# Patient Record
Sex: Female | Born: 1940 | Race: White | Hispanic: No | Marital: Married | Smoking: Former smoker
Health system: Southern US, Community
[De-identification: ages and names within clinical notes are randomized; demographics above are authoritative.]

## PROBLEM LIST (undated history)

## (undated) DIAGNOSIS — F419 Anxiety disorder, unspecified: Secondary | ICD-10-CM

## (undated) DIAGNOSIS — N189 Chronic kidney disease, unspecified: Secondary | ICD-10-CM

## (undated) DIAGNOSIS — B977 Papillomavirus as the cause of diseases classified elsewhere: Secondary | ICD-10-CM

## (undated) DIAGNOSIS — L309 Dermatitis, unspecified: Secondary | ICD-10-CM

## (undated) DIAGNOSIS — Z973 Presence of spectacles and contact lenses: Secondary | ICD-10-CM

## (undated) DIAGNOSIS — I10 Essential (primary) hypertension: Secondary | ICD-10-CM

## (undated) DIAGNOSIS — Z8719 Personal history of other diseases of the digestive system: Secondary | ICD-10-CM

## (undated) DIAGNOSIS — Z87410 Personal history of cervical dysplasia: Secondary | ICD-10-CM

## (undated) DIAGNOSIS — K589 Irritable bowel syndrome without diarrhea: Secondary | ICD-10-CM

## (undated) DIAGNOSIS — Z8709 Personal history of other diseases of the respiratory system: Secondary | ICD-10-CM

## (undated) DIAGNOSIS — K219 Gastro-esophageal reflux disease without esophagitis: Secondary | ICD-10-CM

## (undated) DIAGNOSIS — C519 Malignant neoplasm of vulva, unspecified: Secondary | ICD-10-CM

## (undated) DIAGNOSIS — Z87440 Personal history of urinary (tract) infections: Secondary | ICD-10-CM

## (undated) HISTORY — PX: TONSILLECTOMY: SUR1361

## (undated) HISTORY — PX: OTHER SURGICAL HISTORY: SHX169

---

## 1969-02-13 HISTORY — PX: VAGINAL HYSTERECTOMY: SUR661

## 2003-08-23 ENCOUNTER — Encounter: Admission: RE | Admit: 2003-08-23 | Discharge: 2003-08-23 | Payer: Self-pay | Admitting: Family Medicine

## 2003-09-26 ENCOUNTER — Other Ambulatory Visit: Admission: RE | Admit: 2003-09-26 | Discharge: 2003-09-26 | Payer: Self-pay | Admitting: Family Medicine

## 2004-11-13 ENCOUNTER — Other Ambulatory Visit: Admission: RE | Admit: 2004-11-13 | Discharge: 2004-11-13 | Payer: Self-pay | Admitting: Family Medicine

## 2005-04-15 ENCOUNTER — Encounter: Admission: RE | Admit: 2005-04-15 | Discharge: 2005-04-15 | Payer: Self-pay | Admitting: Family Medicine

## 2005-11-18 ENCOUNTER — Other Ambulatory Visit: Admission: RE | Admit: 2005-11-18 | Discharge: 2005-11-18 | Payer: Self-pay | Admitting: Family Medicine

## 2006-04-21 ENCOUNTER — Encounter: Admission: RE | Admit: 2006-04-21 | Discharge: 2006-04-21 | Payer: Self-pay | Admitting: Family Medicine

## 2006-05-05 ENCOUNTER — Encounter: Admission: RE | Admit: 2006-05-05 | Discharge: 2006-05-05 | Payer: Self-pay | Admitting: Family Medicine

## 2007-01-26 ENCOUNTER — Other Ambulatory Visit: Admission: RE | Admit: 2007-01-26 | Discharge: 2007-01-26 | Payer: Self-pay | Admitting: Family Medicine

## 2008-03-08 ENCOUNTER — Encounter: Admission: RE | Admit: 2008-03-08 | Discharge: 2008-03-08 | Payer: Self-pay | Admitting: Family Medicine

## 2008-04-19 ENCOUNTER — Ambulatory Visit: Payer: Self-pay | Admitting: Internal Medicine

## 2008-05-03 ENCOUNTER — Encounter: Payer: Self-pay | Admitting: Internal Medicine

## 2008-05-03 ENCOUNTER — Ambulatory Visit: Payer: Self-pay | Admitting: Internal Medicine

## 2008-05-03 HISTORY — PX: COLONOSCOPY: SHX174

## 2008-05-05 ENCOUNTER — Encounter: Payer: Self-pay | Admitting: Internal Medicine

## 2009-04-30 ENCOUNTER — Other Ambulatory Visit: Admission: RE | Admit: 2009-04-30 | Discharge: 2009-04-30 | Payer: Self-pay | Admitting: Family Medicine

## 2010-05-26 ENCOUNTER — Encounter
Admission: RE | Admit: 2010-05-26 | Discharge: 2010-05-26 | Payer: Self-pay | Source: Home / Self Care | Attending: Family Medicine | Admitting: Family Medicine

## 2010-07-06 ENCOUNTER — Encounter: Payer: Self-pay | Admitting: Family Medicine

## 2011-02-12 ENCOUNTER — Ambulatory Visit
Admission: RE | Admit: 2011-02-12 | Discharge: 2011-02-12 | Disposition: A | Discharge status: Home / Self Care | Payer: Medicare Other | Source: Ambulatory Visit | Attending: Chiropractic Medicine | Admitting: Chiropractic Medicine

## 2011-02-12 ENCOUNTER — Other Ambulatory Visit: Payer: Self-pay | Admitting: Chiropractic Medicine

## 2011-02-12 DIAGNOSIS — M545 Low back pain: Secondary | ICD-10-CM

## 2011-02-12 DIAGNOSIS — M542 Cervicalgia: Secondary | ICD-10-CM

## 2011-02-12 DIAGNOSIS — M5412 Radiculopathy, cervical region: Secondary | ICD-10-CM

## 2011-12-29 ENCOUNTER — Other Ambulatory Visit: Payer: Self-pay | Admitting: Family Medicine

## 2011-12-29 DIAGNOSIS — Z1231 Encounter for screening mammogram for malignant neoplasm of breast: Secondary | ICD-10-CM

## 2011-12-31 ENCOUNTER — Ambulatory Visit
Admission: RE | Admit: 2011-12-31 | Discharge: 2011-12-31 | Disposition: A | Discharge status: Home / Self Care | Payer: Medicare Other | Source: Ambulatory Visit | Attending: Family Medicine | Admitting: Family Medicine

## 2011-12-31 DIAGNOSIS — Z1231 Encounter for screening mammogram for malignant neoplasm of breast: Secondary | ICD-10-CM

## 2012-01-07 ENCOUNTER — Other Ambulatory Visit: Payer: Self-pay | Admitting: Family Medicine

## 2012-01-07 ENCOUNTER — Other Ambulatory Visit (HOSPITAL_COMMUNITY)
Admission: RE | Admit: 2012-01-07 | Discharge: 2012-01-07 | Disposition: A | Discharge status: Home / Self Care | Payer: Medicare Other | Source: Ambulatory Visit | Attending: Family Medicine | Admitting: Family Medicine

## 2012-01-07 DIAGNOSIS — Z01419 Encounter for gynecological examination (general) (routine) without abnormal findings: Secondary | ICD-10-CM | POA: Insufficient documentation

## 2013-04-03 ENCOUNTER — Other Ambulatory Visit: Payer: Self-pay

## 2013-04-03 DIAGNOSIS — Z1231 Encounter for screening mammogram for malignant neoplasm of breast: Secondary | ICD-10-CM

## 2013-04-27 ENCOUNTER — Ambulatory Visit: Payer: Medicare Other

## 2013-05-18 ENCOUNTER — Ambulatory Visit
Admission: RE | Admit: 2013-05-18 | Discharge: 2013-05-18 | Disposition: A | Discharge status: Home / Self Care | Payer: Self-pay | Source: Ambulatory Visit

## 2013-05-18 DIAGNOSIS — Z1231 Encounter for screening mammogram for malignant neoplasm of breast: Secondary | ICD-10-CM

## 2014-04-30 ENCOUNTER — Other Ambulatory Visit: Payer: Self-pay | Admitting: Chiropractic Medicine

## 2014-04-30 ENCOUNTER — Ambulatory Visit
Admission: RE | Admit: 2014-04-30 | Discharge: 2014-04-30 | Disposition: A | Discharge status: Home / Self Care | Payer: Commercial Managed Care - HMO | Source: Ambulatory Visit | Attending: Chiropractic Medicine | Admitting: Chiropractic Medicine

## 2014-04-30 DIAGNOSIS — R52 Pain, unspecified: Secondary | ICD-10-CM

## 2014-12-19 ENCOUNTER — Emergency Department (HOSPITAL_BASED_OUTPATIENT_CLINIC_OR_DEPARTMENT_OTHER): Payer: PPO

## 2014-12-19 ENCOUNTER — Encounter (HOSPITAL_BASED_OUTPATIENT_CLINIC_OR_DEPARTMENT_OTHER): Payer: Self-pay | Admitting: Emergency Medicine

## 2014-12-19 ENCOUNTER — Emergency Department (HOSPITAL_BASED_OUTPATIENT_CLINIC_OR_DEPARTMENT_OTHER)
Admission: EM | Admit: 2014-12-19 | Discharge: 2014-12-19 | Disposition: A | Discharge status: Home / Self Care | Payer: PPO | Attending: Emergency Medicine | Admitting: Emergency Medicine

## 2014-12-19 DIAGNOSIS — W19XXXA Unspecified fall, initial encounter: Secondary | ICD-10-CM

## 2014-12-19 DIAGNOSIS — Y9389 Activity, other specified: Secondary | ICD-10-CM | POA: Diagnosis not present

## 2014-12-19 DIAGNOSIS — S32028A Other fracture of second lumbar vertebra, initial encounter for closed fracture: Secondary | ICD-10-CM | POA: Insufficient documentation

## 2014-12-19 DIAGNOSIS — Z79899 Other long term (current) drug therapy: Secondary | ICD-10-CM | POA: Insufficient documentation

## 2014-12-19 DIAGNOSIS — W01198A Fall on same level from slipping, tripping and stumbling with subsequent striking against other object, initial encounter: Secondary | ICD-10-CM | POA: Diagnosis not present

## 2014-12-19 DIAGNOSIS — S3992XA Unspecified injury of lower back, initial encounter: Secondary | ICD-10-CM | POA: Diagnosis present

## 2014-12-19 DIAGNOSIS — Y929 Unspecified place or not applicable: Secondary | ICD-10-CM | POA: Insufficient documentation

## 2014-12-19 DIAGNOSIS — Y998 Other external cause status: Secondary | ICD-10-CM | POA: Diagnosis not present

## 2014-12-19 DIAGNOSIS — Z87891 Personal history of nicotine dependence: Secondary | ICD-10-CM | POA: Insufficient documentation

## 2014-12-19 DIAGNOSIS — S32020A Wedge compression fracture of second lumbar vertebra, initial encounter for closed fracture: Secondary | ICD-10-CM

## 2014-12-19 DIAGNOSIS — F419 Anxiety disorder, unspecified: Secondary | ICD-10-CM | POA: Insufficient documentation

## 2014-12-19 HISTORY — DX: Anxiety disorder, unspecified: F41.9

## 2014-12-19 MED ORDER — ONDANSETRON 4 MG PO TBDP
4.0000 mg | ORAL_TABLET | Freq: Once | ORAL | Status: AC
  Administered 2014-12-19: 4 mg via ORAL
  Filled 2014-12-19: qty 1

## 2014-12-19 MED ORDER — MORPHINE SULFATE 4 MG/ML IJ SOLN
6.0000 mg | Freq: Once | INTRAMUSCULAR | Status: AC
  Administered 2014-12-19: 6 mg via INTRAMUSCULAR
  Filled 2014-12-19: qty 2

## 2014-12-19 MED ORDER — HYDROCODONE-ACETAMINOPHEN 5-325 MG PO TABS: 1.0000 | ORAL_TABLET | ORAL | Status: DC | PRN

## 2014-12-19 NOTE — ED Provider Notes (Signed)
CSN: [id number]     Arrival date & time 12/19/14  1945 History  This chart was scribed for Andrea Furry, MD by Steva Colder, ED Scribe. The patient was seen in room MH11/MH11 at 8:19 PM.     Chief Complaint  Patient presents with  . Fall      The history is provided by the patient. No language interpreter was used.    HPI Comments: Andrea Gomez is a 74 y.o. female who presents to the Emergency Department complaining of fall onset tonight. Pt notes that she was playing with her grandson when she took a step backwards, tripped, and fell onto her buttocks. Pt notes that she has not walked since the incident occurred. Pt rates her back pain as 6/10 and that she has had mild issues with her back that she sees an orthopedist for. She states that she is having associated symptoms of left buttock pain. She denies hitting her head, LOC, neck pain, tingling, leg pain, HA and any other symptoms. Pt denies taking blood thinners. Denies any allergies to medications.   Past Medical History  Diagnosis Date  . Anxiety    Past Surgical History  Procedure Laterality Date  . Abdominal hysterectomy    . Wrist surgery    . Tonsillectomy     No family history on file. History  Substance Use Topics  . Smoking status: Former Research scientist (life sciences)  . Smokeless tobacco: Not on file  . Alcohol Use: No   OB History    No data available     Review of Systems  Constitutional: Negative for fever, chills, diaphoresis, appetite change and fatigue.  HENT: Negative for mouth sores, sore throat and trouble swallowing.   Eyes: Negative for visual disturbance.  Respiratory: Negative for cough, chest tightness, shortness of breath and wheezing.   Cardiovascular: Negative for chest pain.  Gastrointestinal: Negative for nausea, vomiting, abdominal pain, diarrhea and abdominal distention.  Endocrine: Negative for polydipsia, polyphagia and polyuria.  Genitourinary: Negative for dysuria, frequency and hematuria.   Musculoskeletal: Positive for back pain. Negative for gait problem and neck pain.  Skin: Negative for color change, pallor and rash.  Neurological: Negative for dizziness, syncope, light-headedness and headaches.  Hematological: Does not bruise/bleed easily.  Psychiatric/Behavioral: Negative for behavioral problems and confusion.      Allergies  Review of patient's allergies indicates no known allergies.  Home Medications   Prior to Admission medications   Medication Sig Start Date End Date Taking? Authorizing Provider  DULoxetine (CYMBALTA) 60 MG capsule Take 60 mg by mouth 2 (two) times daily.   Yes Historical Provider, MD  HYDROcodone-acetaminophen (NORCO/VICODIN) 5-325 MG per tablet Take 1 tablet by mouth every 4 (four) hours as needed. 12/19/14   Andrea Furry, MD   BP 162/80 mmHg  Pulse 96  Temp(Src) 97.7 F (36.5 C) (Oral)  Resp 20  Ht 5\' 3"  (1.6 m)  Wt 161 lb 9.6 oz (73.301 kg)  BMI 28.63 kg/m2  SpO2 97% Physical Exam  Constitutional: She is oriented to person, place, and time. She appears well-developed and well-nourished. No distress.  HENT:  Head: Normocephalic.  Eyes: Conjunctivae are normal. Pupils are equal, round, and reactive to light. No scleral icterus.  Neck: Normal range of motion. Neck supple. No thyromegaly present.  Cardiovascular: Normal rate and regular rhythm.  Exam reveals no gallop and no friction rub.   No murmur heard. Pulmonary/Chest: Effort normal and breath sounds normal. No respiratory distress. She has no wheezes. She has no  rales.  Abdominal: Soft. Bowel sounds are normal. She exhibits no distension. There is no tenderness. There is no rebound.  Musculoskeletal: Normal range of motion.       Lumbar back: She exhibits tenderness.  Midline and paraspinal tenderness on the left from L1 and down. Nl strength and sensation to lower extremities.   Neurological: She is alert and oriented to person, place, and time.  Skin: Skin is warm and dry. No  rash noted.  Psychiatric: She has a normal mood and affect. Her behavior is normal.  Nursing note and vitals reviewed.   ED Course  Procedures (including critical care time) DIAGNOSTIC STUDIES: Oxygen Saturation is 97% on RA, nl by my interpretation.    COORDINATION OF CARE: 8:24 PM-Discussed treatment plan which includes muscle relaxer, pain medication, L-spine X-ray with pt at bedside and pt agreed to plan.   Labs Review Labs Reviewed - No data to display  Imaging Review Dg Thoracic Spine 2 View  12/19/2014   CLINICAL DATA:  Fall with diffuse thoracic spine pain. Initial encounter.  EXAM: THORACIC SPINE - 2-3 VIEWS  COMPARISON:  None.  FINDINGS: No evidence of thoracic spine fracture or traumatic malalignment. No notable degenerative change.  L2 compression fracture, reference dedicated lumbar spine imaging.  IMPRESSION: 1. Negative thoracic spine. 2. L2 compression fracture, reference dedicated imaging.   Electronically Signed   By: Monte Fantasia M.D.   On: 12/19/2014 21:40   Dg Lumbar Spine Complete  12/19/2014   CLINICAL DATA:  Fall, low back pain  EXAM: LUMBAR SPINE - COMPLETE 4+ VIEW  COMPARISON:  04/30/2014  FINDINGS: Alignment is normal. Intervertebral disc spaces are maintained. Atheromatous aortic calcification without aneurysm. Mild superior endplate compression deformity at L2 is identified, new since the prior 04/30/2014 exam but otherwise age indeterminate. No bony retropulsion. Stable degree of 5 mm anterolisthesis L5 on S1.  IMPRESSION: Mild superior endplate compression deformity at L2, new since the prior exam 04/30/2014 but otherwise age indeterminate, without bony retropulsion.   Electronically Signed   By: Conchita Paris M.D.   On: 12/19/2014 21:36   Dg Pelvis 1-2 Views  12/19/2014   CLINICAL DATA:  Fall  EXAM: PELVIS - 1-2 VIEW  COMPARISON:  04/30/2014  FINDINGS: There is no evidence of pelvic fracture or diastasis. No pelvic bone lesions are seen.  IMPRESSION:  Negative.   Electronically Signed   By: Conchita Paris M.D.   On: 12/19/2014 21:38     EKG Interpretation None      MDM   Final diagnoses:  Fall  Compression fracture of L2, closed, initial encounter   On recheck patient is laying on her right side. Hips and knees bent. That she feels much much better". Discussed the findings of her x-rays with her. The described compression fracture. Described treatment with pain control. Slowly increasing activity. Neurosurgical follow-up if not improving. She reports no neurological symptoms and has no neurological findings as intact on exam. Currently tolerating symptoms.  I personally performed the services described in this documentation, which was scribed in my presence. The recorded information has been reviewed and is accurate.   Andrea Furry, MD 12/19/14 2201

## 2014-12-19 NOTE — ED Notes (Addendum)
MD at bedside.  C-Collar removed by MD

## 2014-12-19 NOTE — ED Notes (Addendum)
Playing with grandson and took a step backward and fell onto buttocks.  Pain in Left buttock.

## 2014-12-19 NOTE — Discharge Instructions (Signed)
Rest. Avoid prolonged sitting or standing. Pain medications as prescribed. Follow-up with Dr. Ronnald Ramp, neurosurgeon, if your symptoms persist greater than 7-10 days.  Back, Compression Fracture A compression fracture happens when a force is put upon the length of your spine. Slipping and falling on your bottom are examples of such a force. When this happens, sometimes the force is great enough to compress the building blocks (vertebral bodies) of your spine. Although this causes a lot of pain, this can usually be treated at home, unless your caregiver feels hospitalization is needed for pain control. Your backbone (spinal column) is made up of 24 main vertebral bodies in addition to the sacrum and coccyx (see illustration). These are held together by tough fibrous tissues (ligaments) and by support of your muscles. Nerve roots pass through the openings between the vertebrae. A sudden wrenching move, injury, or a fall may cause a compression fracture of one of the vertebral bodies. This may result in back pain or spread of pain into the belly (abdomen), the buttocks, and down the leg into the foot. Pain may also be created by muscle spasm alone. Large studies have been undertaken to determine the best possible course of action to help your back following injury and also to prevent future problems. The recommendations are as follows. FOLLOWING A COMPRESSION FRACTURE: Do the following only if advised by your caregiver.   If a back brace has been suggested or provided, wear it as directed.  Do not stop wearing the back brace unless instructed by your caregiver.  When allowed to return to regular activities, avoid a sedentary lifestyle. Actively exercise. Sporadic weekend binges of tennis, racquetball, or waterskiing may actually aggravate or create problems, especially if you are not in condition for that activity.  Avoid sports requiring sudden body movements until you are in condition for them. Swimming  and walking are safer activities.  Maintain good posture.  Avoid obesity.  If not already done, you should have a DEXA scan. Based on the results, be treated for osteoporosis. FOLLOWING ACUTE (SUDDEN) INJURY:  Only take over-the-counter or prescription medicines for pain, discomfort, or fever as directed by your caregiver.  Use bed rest for only the most extreme acute episode. Prolonged bed rest may aggravate your condition. Ice used for acute conditions is effective. Use a large plastic bag filled with ice. Wrap it in a towel. This also provides excellent pain relief. This may be continuous. Or use it for 30 minutes every 2 hours during acute phase, then as needed. Heat for 30 minutes prior to activities is helpful.  As soon as the acute phase (the time when your back is too painful for you to do normal activities) is over, it is important to resume normal activities and work Tourist information centre manager. Back injuries can cause potentially marked changes in lifestyle. So it is important to attack these problems aggressively.  See your caregiver for continued problems. He or she can help or refer you for appropriate exercises, physical therapy, and work hardening if needed.  If you are given narcotic medications for your condition, for the next 24 hours do not:  Drive.  Operate machinery or power tools.  Sign legal documents.  Do not drink alcohol, or take sleeping pills or other medications that may interfere with treatment. If your caregiver has given you a follow-up appointment, it is very important to keep that appointment. Not keeping the appointment could result in a chronic or permanent injury, pain, and disability. If there is  any problem keeping the appointment, you must call back to this facility for assistance.  SEEK IMMEDIATE MEDICAL CARE IF:  You develop numbness, tingling, weakness, or problems with the use of your arms or legs.  You develop severe back pain not relieved with  medications.  You have changes in bowel or bladder control.  You have increasing pain in any areas of the body. Document Released: 06/01/2005 Document Revised: 10/16/2013 Document Reviewed: 01/04/2008 [facility] Patient Information 2015 [place], [place]. This information is not intended to replace advice given to you by your health care provider. Make sure you discuss any questions you have with your health care provider.

## 2015-08-22 DIAGNOSIS — M859 Disorder of bone density and structure, unspecified: Secondary | ICD-10-CM | POA: Diagnosis not present

## 2015-08-22 DIAGNOSIS — M8589 Other specified disorders of bone density and structure, multiple sites: Secondary | ICD-10-CM | POA: Diagnosis not present

## 2015-09-19 DIAGNOSIS — N9089 Other specified noninflammatory disorders of vulva and perineum: Secondary | ICD-10-CM | POA: Diagnosis not present

## 2015-09-19 DIAGNOSIS — M8580 Other specified disorders of bone density and structure, unspecified site: Secondary | ICD-10-CM | POA: Diagnosis not present

## 2015-09-19 DIAGNOSIS — Z1239 Encounter for other screening for malignant neoplasm of breast: Secondary | ICD-10-CM | POA: Diagnosis not present

## 2015-09-19 DIAGNOSIS — Z0000 Encounter for general adult medical examination without abnormal findings: Secondary | ICD-10-CM | POA: Diagnosis not present

## 2015-09-19 DIAGNOSIS — Z1389 Encounter for screening for other disorder: Secondary | ICD-10-CM | POA: Diagnosis not present

## 2015-09-19 DIAGNOSIS — M545 Low back pain: Secondary | ICD-10-CM | POA: Diagnosis not present

## 2015-09-19 DIAGNOSIS — Z1211 Encounter for screening for malignant neoplasm of colon: Secondary | ICD-10-CM | POA: Diagnosis not present

## 2015-09-19 DIAGNOSIS — F419 Anxiety disorder, unspecified: Secondary | ICD-10-CM | POA: Diagnosis not present

## 2015-09-19 DIAGNOSIS — E7800 Pure hypercholesterolemia, unspecified: Secondary | ICD-10-CM | POA: Diagnosis not present

## 2015-09-19 DIAGNOSIS — L230 Allergic contact dermatitis due to metals: Secondary | ICD-10-CM | POA: Diagnosis not present

## 2015-09-20 ENCOUNTER — Other Ambulatory Visit: Payer: Self-pay | Admitting: Family Medicine

## 2015-09-20 DIAGNOSIS — Z1231 Encounter for screening mammogram for malignant neoplasm of breast: Secondary | ICD-10-CM

## 2015-09-25 DIAGNOSIS — Z1211 Encounter for screening for malignant neoplasm of colon: Secondary | ICD-10-CM | POA: Diagnosis not present

## 2015-10-03 ENCOUNTER — Ambulatory Visit
Admission: RE | Admit: 2015-10-03 | Discharge: 2015-10-03 | Disposition: A | Discharge status: Home / Self Care | Payer: PPO | Source: Ambulatory Visit | Attending: Family Medicine | Admitting: Family Medicine

## 2015-10-03 ENCOUNTER — Other Ambulatory Visit: Payer: Self-pay | Admitting: Nurse Practitioner

## 2015-10-03 DIAGNOSIS — Z1231 Encounter for screening mammogram for malignant neoplasm of breast: Secondary | ICD-10-CM | POA: Diagnosis not present

## 2015-10-03 DIAGNOSIS — N903 Dysplasia of vulva, unspecified: Secondary | ICD-10-CM | POA: Diagnosis not present

## 2015-10-03 DIAGNOSIS — N901 Moderate vulvar dysplasia: Secondary | ICD-10-CM | POA: Diagnosis not present

## 2015-10-03 DIAGNOSIS — S30824A Blister (nonthermal) of vagina and vulva, initial encounter: Secondary | ICD-10-CM | POA: Diagnosis not present

## 2015-10-03 DIAGNOSIS — D071 Carcinoma in situ of vulva: Secondary | ICD-10-CM | POA: Diagnosis not present

## 2015-10-03 DIAGNOSIS — C519 Malignant neoplasm of vulva, unspecified: Secondary | ICD-10-CM | POA: Diagnosis not present

## 2015-10-08 ENCOUNTER — Other Ambulatory Visit: Payer: Self-pay | Admitting: Nurse Practitioner

## 2015-10-08 DIAGNOSIS — L089 Local infection of the skin and subcutaneous tissue, unspecified: Secondary | ICD-10-CM | POA: Diagnosis not present

## 2015-10-14 DIAGNOSIS — Z87891 Personal history of nicotine dependence: Secondary | ICD-10-CM | POA: Diagnosis not present

## 2015-10-14 DIAGNOSIS — Z90710 Acquired absence of both cervix and uterus: Secondary | ICD-10-CM | POA: Diagnosis not present

## 2015-10-14 DIAGNOSIS — D071 Carcinoma in situ of vulva: Secondary | ICD-10-CM | POA: Diagnosis not present

## 2015-10-14 DIAGNOSIS — C519 Malignant neoplasm of vulva, unspecified: Secondary | ICD-10-CM | POA: Diagnosis not present

## 2015-10-14 DIAGNOSIS — Z87410 Personal history of cervical dysplasia: Secondary | ICD-10-CM | POA: Diagnosis not present

## 2015-10-25 DIAGNOSIS — C519 Malignant neoplasm of vulva, unspecified: Secondary | ICD-10-CM | POA: Diagnosis not present

## 2015-10-25 DIAGNOSIS — Z87891 Personal history of nicotine dependence: Secondary | ICD-10-CM | POA: Diagnosis not present

## 2015-10-25 DIAGNOSIS — D071 Carcinoma in situ of vulva: Secondary | ICD-10-CM | POA: Diagnosis not present

## 2015-10-25 DIAGNOSIS — Z90710 Acquired absence of both cervix and uterus: Secondary | ICD-10-CM | POA: Diagnosis not present

## 2015-10-25 DIAGNOSIS — Z87410 Personal history of cervical dysplasia: Secondary | ICD-10-CM | POA: Diagnosis not present

## 2015-12-09 ENCOUNTER — Encounter: Payer: Self-pay | Admitting: Gynecologic Oncology

## 2015-12-09 ENCOUNTER — Ambulatory Visit: Payer: PPO | Attending: Gynecologic Oncology | Admitting: Gynecologic Oncology

## 2015-12-09 VITALS — BP 139/75 | HR 102 | Temp 99.4°F | Resp 18 | Wt 166.2 lb

## 2015-12-09 DIAGNOSIS — E669 Obesity, unspecified: Secondary | ICD-10-CM | POA: Insufficient documentation

## 2015-12-09 DIAGNOSIS — Z90710 Acquired absence of both cervix and uterus: Secondary | ICD-10-CM | POA: Insufficient documentation

## 2015-12-09 DIAGNOSIS — C519 Malignant neoplasm of vulva, unspecified: Secondary | ICD-10-CM | POA: Diagnosis not present

## 2015-12-09 DIAGNOSIS — Z6829 Body mass index (BMI) 29.0-29.9, adult: Secondary | ICD-10-CM | POA: Insufficient documentation

## 2015-12-09 DIAGNOSIS — Z87891 Personal history of nicotine dependence: Secondary | ICD-10-CM | POA: Diagnosis not present

## 2015-12-09 DIAGNOSIS — Z8541 Personal history of malignant neoplasm of cervix uteri: Secondary | ICD-10-CM | POA: Diagnosis not present

## 2015-12-09 DIAGNOSIS — F419 Anxiety disorder, unspecified: Secondary | ICD-10-CM | POA: Insufficient documentation

## 2015-12-09 NOTE — Patient Instructions (Signed)
Preparing for your Surgery  Plan for surgery on July 5 , 2017 with Dr.Emma Denman George for a vulvectomy at the [facility]. You will receive a call from the out patient surgical center. Please call if you any changes , questions or concerns.  Thank you!

## 2015-12-09 NOTE — Progress Notes (Signed)
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 Vulvar Cancer    New patient, second opinion    Assessment/Plan:  [name]  is a 75 y.o.  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 The patient is very anxious about an excisional procedure, loss of clitoral function and deformity.  However, I explained to her that I do not feel comfortable performing an ablative procedure such as laser, in the face of confirmed invasive cancer on biopsy. Additionally, there is some nodularity anteriorally which is suspicious for invasive carcinoma.   I recommend wide local excision of the left anterior vulva. We will attempt to spare the clitoral glans, but will need to resect the left hood of the clitoris. I explained that the margin at this site may be positive for VIN (or even carcinoma) which increases the risk for recurrence, and may need to be re-resected. We will then evaluate pathology and depth of invasion. If invasion is >58mm, she will require bilateral lymphadenectomy and possibly, deeper re-excision.  The patient is agreeable to the plan.  I discussed surgical risks, including the potential for infection, wound separation, sexual dysfunction.  I explained postoperative healing and restrictions. Surgery is scheduled for next week.   HPI: Andrea Gomez is a 75 year old woman who is seen as a second opinion for vulvar cancer. The patient was seen by NP Auma Thongteum in April 2017 for annual surveillance visit. She reported the symptom of vulvar pruritus at this time and therefore was evaluated. Several lesions on the left and anterior vulva were appreciated and were biopsied. A biopsy marked vulvar biopsy red upper was resulted as high-grade vulvar intraepithelial neoplasia, VIN 3, with focal microscopic invasive squamous cell  carcinoma. The a.m. 3 was also identified and all other sites of biopsies which included the left inner the left outer, a left black spot, and white upper. The specific anatomic location of these biopsy sites was not available.  The patient was seen in consultation by Dr. Benjamine Mola skin and [facility] on 10/25/2015 and was recommended to undergo an excisional procedure of the vulva to determine the depth of invasion and the need for lymphadenectomy and possible radical vulvectomy. The patient was concerned about healing and to formally following such a surgery and spent a period of time evaluating online literature. She had done some reading which showed that it was [facility] office minimally invasive surgery approaches such as laparoscopy and was wondering if this was appropriate for her.  The patient has a remote history of HPV related cervical dysplasia and, approximately 40 years ago, underwent a total hysterectomy for this. She had routine Surveillance in the intervening years and denies being HPV positive during that time.  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 She is a nonsmoker.   Current Meds:  Outpatient Encounter Prescriptions as of 12/09/2015  Medication Sig  . cyclobenzaprine (FLEXERIL) 10 MG tablet Take 10 mg by mouth at bedtime.  . DULoxetine (CYMBALTA) 60 MG capsule Take 60 mg by mouth 2 (two) times daily.  . nabumetone (RELAFEN) 750 MG tablet Take 750 mg by mouth 2 (two) times daily as needed.  . triamcinolone cream (KENALOG) 0.1 % apply to affected area twice a day sparingly  . [DISCONTINUED] HYDROcodone-acetaminophen (NORCO/VICODIN) 5-325 MG per tablet  Take 1 tablet by mouth every 4 (four) hours as needed.   No facility-administered encounter medications on file as of 12/09/2015.    Allergy: No Known Allergies  Social Hx:   Social History   Social History  . Marital Status: Married    Spouse Name: N/A  . Number of Children:  N/A  . Years of Education: N/A   Occupational History  . Not on file.   Social History Main Topics  . Smoking status: Former Smoker    Quit date: 12/09/1978  . Smokeless tobacco: Not on file  . Alcohol Use: No  . Drug Use: No  . Sexual Activity: Yes    Birth Control/ Protection: Surgical   Other Topics Concern  . Not on file   Social History Narrative    Past Surgical Hx:  Past Surgical History  Procedure Laterality Date  . Abdominal hysterectomy    . Wrist surgery    . Tonsillectomy      Past Medical Hx:  Past Medical History  Diagnosis Date  . Anxiety   . Cervical cancer (HCC)     DX in late 1970's    Past Gynecological History:  Hysterectomy for CIN  No LMP recorded. Patient has had a hysterectomy.  Family Hx: History reviewed. No pertinent family history.  Review of Systems:  Constitutional  Feels well,    ENT Normal appearing ears and nares bilaterally Skin/Breast  No rash, sores, jaundice, itching, dryness Cardiovascular  No chest pain, shortness of breath, or edema  Pulmonary  No cough or wheeze.  Gastro Intestinal  No nausea, vomitting, or diarrhoea. No bright red blood per rectum, no abdominal pain, change in bowel movement, or constipation.  Genito Urinary  No frequency, urgency, dysuria, + vulvar pruritis Musculo Skeletal  No myalgia, arthralgia, joint swelling or pain  Neurologic  No weakness, numbness, change in gait,  Psychology  No depression, anxiety, insomnia.   Vitals:  Blood pressure 139/75, pulse 102, temperature 99.4 F (37.4 C), temperature source Oral, resp. rate 18, weight 166 lb 3.2 oz (75.388 kg), SpO2 98 %.  Physical Exam: WD in NAD Neck  Supple NROM, without any enlargements.  Lymph Node Survey No cervical supraclavicular or inguinal adenopathy Cardiovascular  Pulse normal rate, regularity and rhythm. S1 and S2 normal.  Lungs  Clear to auscultation bilateraly, without wheezes/crackles/rhonchi. Good air movement.   Skin  No rash/lesions/breakdown  Psychiatry  Alert and oriented to person, place, and time  Abdomen  Normoactive bowel sounds, abdomen soft, non-tender and obese without evidence of hernia.  Back No CVA tenderness Genito Urinary  Vulva/vagina: 5% acetic acid was applied to the vulva and a broad 6cm plaque was appreciated that included acetowhite changes and erythema and extended from the midline anteriorally, inclusive of the left clitoral hood, to the left labia minora and interlabial fold and extended posteriorally along halfway of the labia minora. There was some mild nodularity in the tissues immediately lateral (left) to the clitoris at the anterior aspect of the labia minora of the left. This is a laterally ambiguous lesion.  Bladder/urethra:  No lesions or masses, well supported bladder  Vagina: normal  Cervix and uterus: surgically absent  Adnexa: no masses. Rectal  deferred  Extremities  No bilateral cyanosis, clubbing or edema.   Donaciano Eva, MD  12/09/2015, 5:58 PM

## 2015-12-10 ENCOUNTER — Encounter (HOSPITAL_BASED_OUTPATIENT_CLINIC_OR_DEPARTMENT_OTHER): Payer: Self-pay | Admitting: *Deleted

## 2015-12-11 ENCOUNTER — Encounter (HOSPITAL_BASED_OUTPATIENT_CLINIC_OR_DEPARTMENT_OTHER): Payer: Self-pay | Admitting: *Deleted

## 2015-12-11 NOTE — Progress Notes (Signed)
NPO AFTER MN.  ARRIVE  AT 0700.  NEEDS HG.  WILL TAKE CYMBALTA AM DOS W/ SIPS OF WATER.

## 2015-12-16 ENCOUNTER — Encounter (HOSPITAL_BASED_OUTPATIENT_CLINIC_OR_DEPARTMENT_OTHER): Payer: Self-pay | Admitting: *Deleted

## 2015-12-16 MED ORDER — LACTATED RINGERS IV SOLN
INTRAVENOUS | Status: DC
  Administered 2015-12-18: 08:00:00 via INTRAVENOUS
  Filled 2015-12-16: qty 1000

## 2015-12-17 NOTE — Anesthesia Preprocedure Evaluation (Addendum)
Anesthesia Evaluation  Patient identified by MRN, date of birth, ID band Patient awake    Reviewed: Allergy & Precautions, NPO status , Patient's Chart, lab work & pertinent test results  Airway Mallampati: II  TM Distance: <3 FB Neck ROM: Full    Dental no notable dental hx. (+) Teeth Intact, Dental Advisory Given, Implants,    Pulmonary neg pulmonary ROS, former smoker,    Pulmonary exam normal breath sounds clear to auscultation       Cardiovascular Exercise Tolerance: Good negative cardio ROS Normal cardiovascular exam Rhythm:Regular Rate:Normal     Neuro/Psych PSYCHIATRIC DISORDERS Anxiety negative neurological ROS     GI/Hepatic negative GI ROS, Neg liver ROS,   Endo/Other  negative endocrine ROS  Renal/GU negative Renal ROS     Musculoskeletal negative musculoskeletal ROS (+)   Abdominal   Peds  Hematology negative hematology ROS (+)   Anesthesia Other Findings   Reproductive/Obstetrics negative OB ROS                           Anesthesia Physical Anesthesia Plan  ASA: II  Anesthesia Plan: General   Post-op Pain Management:    Induction: Intravenous  Airway Management Planned: LMA  Additional Equipment:   Intra-op Plan:   Post-operative Plan: Extubation in OR  Informed Consent: I have reviewed the patients History and Physical, chart, labs and discussed the procedure including the risks, benefits and alternatives for the proposed anesthesia with the patient or authorized representative who has indicated his/her understanding and acceptance.   Dental advisory given  Plan Discussed with: CRNA  Anesthesia Plan Comments:         Anesthesia Quick Evaluation

## 2015-12-18 ENCOUNTER — Encounter (HOSPITAL_COMMUNITY)
Admission: RE | Disposition: A | Discharge status: Home / Self Care | Payer: Self-pay | Source: Ambulatory Visit | Attending: Gynecologic Oncology

## 2015-12-18 ENCOUNTER — Ambulatory Visit (HOSPITAL_COMMUNITY): Payer: PPO | Admitting: Anesthesiology

## 2015-12-18 ENCOUNTER — Encounter (HOSPITAL_COMMUNITY): Payer: Self-pay | Admitting: Anesthesiology

## 2015-12-18 ENCOUNTER — Ambulatory Visit (HOSPITAL_COMMUNITY)
Admission: RE | Admit: 2015-12-18 | Discharge: 2015-12-18 | Disposition: A | Discharge status: Home / Self Care | Payer: PPO | Source: Ambulatory Visit | Attending: Gynecologic Oncology | Admitting: Gynecologic Oncology

## 2015-12-18 DIAGNOSIS — C519 Malignant neoplasm of vulva, unspecified: Secondary | ICD-10-CM | POA: Diagnosis not present

## 2015-12-18 DIAGNOSIS — Z79899 Other long term (current) drug therapy: Secondary | ICD-10-CM | POA: Insufficient documentation

## 2015-12-18 DIAGNOSIS — Z87891 Personal history of nicotine dependence: Secondary | ICD-10-CM | POA: Insufficient documentation

## 2015-12-18 DIAGNOSIS — Z90710 Acquired absence of both cervix and uterus: Secondary | ICD-10-CM | POA: Insufficient documentation

## 2015-12-18 DIAGNOSIS — F419 Anxiety disorder, unspecified: Secondary | ICD-10-CM | POA: Insufficient documentation

## 2015-12-18 DIAGNOSIS — D071 Carcinoma in situ of vulva: Secondary | ICD-10-CM | POA: Insufficient documentation

## 2015-12-18 HISTORY — DX: Personal history of urinary (tract) infections: Z87.440

## 2015-12-18 HISTORY — DX: Malignant neoplasm of vulva, unspecified: C51.9

## 2015-12-18 HISTORY — DX: Presence of spectacles and contact lenses: Z97.3

## 2015-12-18 HISTORY — DX: Personal history of other diseases of the respiratory system: Z87.09

## 2015-12-18 HISTORY — PX: VULVECTOMY: SHX1086

## 2015-12-18 HISTORY — DX: Personal history of cervical dysplasia: Z87.410

## 2015-12-18 LAB — BASIC METABOLIC PANEL
Anion gap: 5 (ref 5–15)
BUN: 29 mg/dL — AB (ref 6–20)
CALCIUM: 9.4 mg/dL (ref 8.9–10.3)
CHLORIDE: 107 mmol/L (ref 101–111)
CO2: 29 mmol/L (ref 22–32)
CREATININE: 0.85 mg/dL (ref 0.44–1.00)
Glucose, Bld: 110 mg/dL — ABNORMAL HIGH (ref 65–99)
Potassium: 4.8 mmol/L (ref 3.5–5.1)
SODIUM: 141 mmol/L (ref 135–145)

## 2015-12-18 LAB — CBC
HCT: 45.4 % (ref 36.0–46.0)
Hemoglobin: 15.4 g/dL — ABNORMAL HIGH (ref 12.0–15.0)
MCH: 30.7 pg (ref 26.0–34.0)
MCHC: 33.9 g/dL (ref 30.0–36.0)
MCV: 90.6 fL (ref 78.0–100.0)
PLATELETS: 236 10*3/uL (ref 150–400)
RBC: 5.01 MIL/uL (ref 3.87–5.11)
RDW: 14.2 % (ref 11.5–15.5)
WBC: 7 10*3/uL (ref 4.0–10.5)

## 2015-12-18 SURGERY — WIDE EXCISION VULVECTOMY
Anesthesia: General | Laterality: Left

## 2015-12-18 MED ORDER — SODIUM CHLORIDE 0.9 % IJ SOLN
INTRAMUSCULAR | Status: AC
  Filled 2015-12-18: qty 10

## 2015-12-18 MED ORDER — LIDOCAINE HCL (CARDIAC) 20 MG/ML IV SOLN
INTRAVENOUS | Status: DC | PRN
  Administered 2015-12-18: 80 mg via INTRAVENOUS

## 2015-12-18 MED ORDER — PHENYLEPHRINE HCL 10 MG/ML IJ SOLN
INTRAMUSCULAR | Status: DC | PRN
  Administered 2015-12-18 (×3): 80 ug via INTRAVENOUS

## 2015-12-18 MED ORDER — DEXAMETHASONE SODIUM PHOSPHATE 4 MG/ML IJ SOLN
INTRAMUSCULAR | Status: DC | PRN
  Administered 2015-12-18: 10 mg via INTRAVENOUS

## 2015-12-18 MED ORDER — ONDANSETRON HCL 4 MG/2ML IJ SOLN
INTRAMUSCULAR | Status: DC | PRN
  Administered 2015-12-18: 4 mg via INTRAVENOUS

## 2015-12-18 MED ORDER — HYDROCODONE-ACETAMINOPHEN 5-325 MG PO TABS: 1.0000 | ORAL_TABLET | Freq: Four times a day (QID) | ORAL | Status: DC | PRN

## 2015-12-18 MED ORDER — DOCUSATE SODIUM 100 MG PO CAPS: 100.0000 mg | ORAL_CAPSULE | Freq: Two times a day (BID) | ORAL | Status: DC

## 2015-12-18 MED ORDER — ONDANSETRON HCL 4 MG/2ML IJ SOLN
INTRAMUSCULAR | Status: AC
  Filled 2015-12-18: qty 2

## 2015-12-18 MED ORDER — PROPOFOL 10 MG/ML IV BOLUS
INTRAVENOUS | Status: DC | PRN
  Administered 2015-12-18: 170 mg via INTRAVENOUS

## 2015-12-18 MED ORDER — FENTANYL CITRATE (PF) 100 MCG/2ML IJ SOLN
INTRAMUSCULAR | Status: DC | PRN
  Administered 2015-12-18 (×2): 50 ug via INTRAVENOUS

## 2015-12-18 MED ORDER — HYDROMORPHONE HCL 1 MG/ML IJ SOLN: 0.2500 mg | INTRAMUSCULAR | Status: DC | PRN

## 2015-12-18 MED ORDER — ACETIC ACID 5 % SOLN
Status: DC | PRN
  Administered 2015-12-18: 1 via TOPICAL

## 2015-12-18 MED ORDER — DEXAMETHASONE SODIUM PHOSPHATE 10 MG/ML IJ SOLN
INTRAMUSCULAR | Status: AC
  Filled 2015-12-18: qty 1

## 2015-12-18 MED ORDER — PROPOFOL 10 MG/ML IV BOLUS
INTRAVENOUS | Status: AC
  Filled 2015-12-18: qty 40

## 2015-12-18 MED ORDER — MIDAZOLAM HCL 2 MG/2ML IJ SOLN
INTRAMUSCULAR | Status: AC
  Filled 2015-12-18: qty 2

## 2015-12-18 MED ORDER — MEPERIDINE HCL 50 MG/ML IJ SOLN: 6.2500 mg | INTRAMUSCULAR | Status: DC | PRN

## 2015-12-18 MED ORDER — HYDROCODONE-ACETAMINOPHEN 5-325 MG PO TABS
1.0000 | ORAL_TABLET | Freq: Once | ORAL | Status: AC
  Administered 2015-12-18: 1 via ORAL
  Filled 2015-12-18: qty 1

## 2015-12-18 MED ORDER — MIDAZOLAM HCL 5 MG/5ML IJ SOLN
INTRAMUSCULAR | Status: DC | PRN
  Administered 2015-12-18: 2 mg via INTRAVENOUS

## 2015-12-18 MED ORDER — FENTANYL CITRATE (PF) 100 MCG/2ML IJ SOLN
INTRAMUSCULAR | Status: AC
  Filled 2015-12-18: qty 2

## 2015-12-18 MED ORDER — SODIUM CHLORIDE 0.9 % IR SOLN
Status: DC | PRN
  Administered 2015-12-18: 1000 mL

## 2015-12-18 MED ORDER — LIDOCAINE HCL (CARDIAC) 20 MG/ML IV SOLN
INTRAVENOUS | Status: AC
  Filled 2015-12-18: qty 5

## 2015-12-18 MED ORDER — LIDOCAINE-EPINEPHRINE 1 %-1:100000 IJ SOLN
INTRAMUSCULAR | Status: DC | PRN
  Administered 2015-12-18: 20 mL

## 2015-12-18 MED ORDER — PROMETHAZINE HCL 25 MG/ML IJ SOLN: 6.2500 mg | INTRAMUSCULAR | Status: DC | PRN

## 2015-12-18 MED ORDER — ARTIFICIAL TEARS OP OINT
TOPICAL_OINTMENT | OPHTHALMIC | Status: AC
  Filled 2015-12-18: qty 3.5

## 2015-12-18 MED ORDER — PHENYLEPHRINE 40 MCG/ML (10ML) SYRINGE FOR IV PUSH (FOR BLOOD PRESSURE SUPPORT)
PREFILLED_SYRINGE | INTRAVENOUS | Status: AC
  Filled 2015-12-18: qty 10

## 2015-12-18 SURGICAL SUPPLY — 44 items
BLADE HEX COATED 2.75 (ELECTRODE) ×1 IMPLANT
BLADE SURG 15 STRL LF DISP TIS (BLADE) ×2 IMPLANT
BLADE SURG 15 STRL SS (BLADE) ×2
CATH ROBINSON RED A/P 14FR (CATHETERS) ×1 IMPLANT
DRAPE LG THREE QUARTER DISP (DRAPES) ×1 IMPLANT
DRAPE UNDER BUTCK 40X44W/POUCH (DRAPES) ×1 IMPLANT
DRSG TELFA 3X8 NADH (GAUZE/BANDAGES/DRESSINGS) IMPLANT
ELECT NDL TIP 2.8 STRL (NEEDLE) IMPLANT
ELECT NEEDLE TIP 2.8 STRL (NEEDLE) IMPLANT
ELECT PENCIL ROCKER SW 15FT (MISCELLANEOUS) ×1 IMPLANT
ELECT REM PT RETURN 9FT ADLT (ELECTROSURGICAL) ×2
ELECTRODE REM PT RTRN 9FT ADLT (ELECTROSURGICAL) IMPLANT
GAUZE SPONGE 4X4 12PLY STRL (GAUZE/BANDAGES/DRESSINGS) ×2 IMPLANT
GAUZE SPONGE 4X4 16PLY XRAY LF (GAUZE/BANDAGES/DRESSINGS) ×4 IMPLANT
GLOVE BIO SURGEON STRL SZ 6 (GLOVE) ×4 IMPLANT
GOWN STRL REUS W/ TWL LRG LVL3 (GOWN DISPOSABLE) ×2 IMPLANT
GOWN STRL REUS W/TWL LRG LVL3 (GOWN DISPOSABLE) ×4
KIT BASIN OR (CUSTOM PROCEDURE TRAY) ×2 IMPLANT
NDL SPNL 22GX7 QUINCKE BK (NEEDLE) ×1 IMPLANT
NEEDLE HYPO 22GX1.5 SAFETY (NEEDLE) ×2 IMPLANT
NEEDLE SPNL 22GX7 QUINCKE BK (NEEDLE) IMPLANT
NS IRRIG 1000ML POUR BTL (IV SOLUTION) ×2 IMPLANT
PACK LITHOTOMY IV (CUSTOM PROCEDURE TRAY) ×2 IMPLANT
PAD DRESSING TELFA 3X8 NADH (GAUZE/BANDAGES/DRESSINGS) ×1 IMPLANT
PENCIL BUTTON HOLSTER BLD 10FT (ELECTRODE) ×1 IMPLANT
SCOPETTES 8  STERILE (MISCELLANEOUS)
SCOPETTES 8 STERILE (MISCELLANEOUS) ×1 IMPLANT
SUT VIC AB 0 CT1 27 (SUTURE) ×2
SUT VIC AB 0 CT1 27XBRD ANTBC (SUTURE) ×1 IMPLANT
SUT VIC AB 2-0 SH 27 (SUTURE)
SUT VIC AB 2-0 SH 27X BRD (SUTURE) IMPLANT
SUT VIC AB 3-0 SH 27 (SUTURE) ×10
SUT VIC AB 3-0 SH 27XBRD (SUTURE) ×2 IMPLANT
SUT VIC AB 4-0 P2 18 (SUTURE) ×2 IMPLANT
SUT VIC AB 4-0 PS2 18 (SUTURE) ×6 IMPLANT
SYR BULB IRRIGATION 50ML (SYRINGE) ×2 IMPLANT
SYR CONTROL 10ML LL (SYRINGE) ×2 IMPLANT
TOWEL OR 17X26 10 PK STRL BLUE (TOWEL DISPOSABLE) ×2 IMPLANT
TOWEL OR NON WOVEN STRL DISP B (DISPOSABLE) ×2 IMPLANT
TRAY FOLEY W/METER SILVER 14FR (SET/KITS/TRAYS/PACK) ×2 IMPLANT
TRAY FOLEY W/METER SILVER 16FR (SET/KITS/TRAYS/PACK) ×2 IMPLANT
UNDERPAD 30X30 INCONTINENT (UNDERPADS AND DIAPERS) ×1 IMPLANT
WATER STERILE IRR 1500ML POUR (IV SOLUTION) ×1 IMPLANT
YANKAUER SUCT BULB TIP NO VENT (SUCTIONS) ×2 IMPLANT

## 2015-12-18 NOTE — H&P (View-Only) (Signed)
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 Vulvar Cancer    New patient, second opinion    Assessment/Plan:  [name]  is a 76 y.o.  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 The patient is very anxious about an excisional procedure, loss of clitoral function and deformity.  However, I explained to her that I do not feel comfortable performing an ablative procedure such as laser, in the face of confirmed invasive cancer on biopsy. Additionally, there is some nodularity anteriorally which is suspicious for invasive carcinoma.   I recommend wide local excision of the left anterior vulva. We will attempt to spare the clitoral glans, but will need to resect the left hood of the clitoris. I explained that the margin at this site may be positive for VIN (or even carcinoma) which increases the risk for recurrence, and may need to be re-resected. We will then evaluate pathology and depth of invasion. If invasion is >14mm, she will require bilateral lymphadenectomy and possibly, deeper re-excision.  The patient is agreeable to the plan.  I discussed surgical risks, including the potential for infection, wound separation, sexual dysfunction.  I explained postoperative healing and restrictions. Surgery is scheduled for next week.   HPI: Andrea Gomez is a 75 year old woman who is seen as a second opinion for vulvar cancer. The patient was seen by NP Auma Thongteum in April 2017 for annual surveillance visit. She reported the symptom of vulvar pruritus at this time and therefore was evaluated. Several lesions on the left and anterior vulva were appreciated and were biopsied. A biopsy marked vulvar biopsy red upper was resulted as high-grade vulvar intraepithelial neoplasia, VIN 3, with focal microscopic invasive squamous cell  carcinoma. The a.m. 3 was also identified and all other sites of biopsies which included the left inner the left outer, a left black spot, and white upper. The specific anatomic location of these biopsy sites was not available.  The patient was seen in consultation by Dr. Benjamine Mola skin and [facility] on 10/25/2015 and was recommended to undergo an excisional procedure of the vulva to determine the depth of invasion and the need for lymphadenectomy and possible radical vulvectomy. The patient was concerned about healing and to formally following such a surgery and spent a period of time evaluating online literature. She had done some reading which showed that it was [facility] office minimally invasive surgery approaches such as laparoscopy and was wondering if this was appropriate for her.  The patient has a remote history of HPV related cervical dysplasia and, approximately 40 years ago, underwent a total hysterectomy for this. She had routine Surveillance in the intervening years and denies being HPV positive during that time.  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 She is a nonsmoker.   Current Meds:  Outpatient Encounter Prescriptions as of 12/09/2015  Medication Sig  . cyclobenzaprine (FLEXERIL) 10 MG tablet Take 10 mg by mouth at bedtime.  . DULoxetine (CYMBALTA) 60 MG capsule Take 60 mg by mouth 2 (two) times daily.  . nabumetone (RELAFEN) 750 MG tablet Take 750 mg by mouth 2 (two) times daily as needed.  . triamcinolone cream (KENALOG) 0.1 % apply to affected area twice a day sparingly  . [DISCONTINUED] HYDROcodone-acetaminophen (NORCO/VICODIN) 5-325 MG per tablet  Take 1 tablet by mouth every 4 (four) hours as needed.   No facility-administered encounter medications on file as of 12/09/2015.    Allergy: No Known Allergies  Social Hx:   Social History   Social History  . Marital Status: Married    Spouse Name: N/A  . Number of Children:  N/A  . Years of Education: N/A   Occupational History  . Not on file.   Social History Main Topics  . Smoking status: Former Smoker    Quit date: 12/09/1978  . Smokeless tobacco: Not on file  . Alcohol Use: No  . Drug Use: No  . Sexual Activity: Yes    Birth Control/ Protection: Surgical   Other Topics Concern  . Not on file   Social History Narrative    Past Surgical Hx:  Past Surgical History  Procedure Laterality Date  . Abdominal hysterectomy    . Wrist surgery    . Tonsillectomy      Past Medical Hx:  Past Medical History  Diagnosis Date  . Anxiety   . Cervical cancer (HCC)     DX in late 1970's    Past Gynecological History:  Hysterectomy for CIN  No LMP recorded. Patient has had a hysterectomy.  Family Hx: History reviewed. No pertinent family history.  Review of Systems:  Constitutional  Feels well,    ENT Normal appearing ears and nares bilaterally Skin/Breast  No rash, sores, jaundice, itching, dryness Cardiovascular  No chest pain, shortness of breath, or edema  Pulmonary  No cough or wheeze.  Gastro Intestinal  No nausea, vomitting, or diarrhoea. No bright red blood per rectum, no abdominal pain, change in bowel movement, or constipation.  Genito Urinary  No frequency, urgency, dysuria, + vulvar pruritis Musculo Skeletal  No myalgia, arthralgia, joint swelling or pain  Neurologic  No weakness, numbness, change in gait,  Psychology  No depression, anxiety, insomnia.   Vitals:  Blood pressure 139/75, pulse 102, temperature 99.4 F (37.4 C), temperature source Oral, resp. rate 18, weight 166 lb 3.2 oz (75.388 kg), SpO2 98 %.  Physical Exam: WD in NAD Neck  Supple NROM, without any enlargements.  Lymph Node Survey No cervical supraclavicular or inguinal adenopathy Cardiovascular  Pulse normal rate, regularity and rhythm. S1 and S2 normal.  Lungs  Clear to auscultation bilateraly, without wheezes/crackles/rhonchi. Good air movement.   Skin  No rash/lesions/breakdown  Psychiatry  Alert and oriented to person, place, and time  Abdomen  Normoactive bowel sounds, abdomen soft, non-tender and obese without evidence of hernia.  Back No CVA tenderness Genito Urinary  Vulva/vagina: 5% acetic acid was applied to the vulva and a broad 6cm plaque was appreciated that included acetowhite changes and erythema and extended from the midline anteriorally, inclusive of the left clitoral hood, to the left labia minora and interlabial fold and extended posteriorally along halfway of the labia minora. There was some mild nodularity in the tissues immediately lateral (left) to the clitoris at the anterior aspect of the labia minora of the left. This is a laterally ambiguous lesion.  Bladder/urethra:  No lesions or masses, well supported bladder  Vagina: normal  Cervix and uterus: surgically absent  Adnexa: no masses. Rectal  deferred  Extremities  No bilateral cyanosis, clubbing or edema.   Donaciano Eva, MD  12/09/2015, 5:58 PM

## 2015-12-18 NOTE — Discharge Instructions (Signed)
Vulvectomy, Care After °The vulva is the external female genitalia, outside and around the vagina and pubic bone. It consists of: °· The skin on, and in front of, the pubic bone. °· The clitoris. °· The labia majora (large lips) on the outside of the vagina. °· The labia minora (small lips) around the opening of the vagina. °· The opening and the skin in and around the vagina. °A vulvectomy is the removal of the tissue of the vulva, which sometimes includes removal of the lymph nodes and tissue in the groin areas. °These discharge instructions provide you with general information on caring for yourself after you leave the hospital. It is also important that you know the warning signs of complications, so that you can seek treatment. Please read the instructions outlined below and refer to this sheet in the next few weeks. Your caregiver may also give you specific information and medicines. If you have any questions or complications after discharge, please call your caregiver. °ACTIVITY °· Rest as much as possible the first two weeks after discharge. °· Arrange to have help from family or others with your daily activities when you go home. °· Avoid heavy lifting (more than 5 pounds), pushing, or pulling. °· If you feel tired, balance your activity with rest periods. °· Follow your caregiver's instruction about climbing stairs and driving a car. °· Increase activity gradually. °· Do not exercise until you have permission from your caregiver. °LEG AND FOOT CARE °If your doctor has removed lymph nodes from your groin area, there may be an increase in swelling of your legs and feet. You can help prevent swelling by doing the following: °· Elevate your legs while sitting or lying down. °· If your caregiver has ordered special stockings, wear them according to instructions. °· Avoid standing in one place for long periods of time. °· Call the physical therapy department if you have any questions about swelling or treatment  for swelling. °· Avoid salt in your diet. It can cause fluid retention and swelling. °· Do not cross your legs, especially when sitting. °NUTRITION °· You may resume your normal diet. °· Drink 6 to 8 glasses of fluids a day. °· Eat a healthy, balanced diet including portions of food from the meat (protein), milk, fruit, vegetable, and bread groups. °· Your caregiver may recommend you take a multivitamin with iron. °ELIMINATION °· You may notice that your stream of urine is at a different angle, and may tend to spray. Using a plastic funnel may help to decrease urine spray. °· If constipation occurs, drink more liquids, and add more fruits, vegetables, and bran to your diet. You may take a mild laxative, such as Milk of Magnesia, Metamucil, or a stool softener such as Colace, with permission from your caregiver. °HYGIENE °· You may shower and wash your hair. °· Check with your caregiver about tub baths. °· Do not add any bath oils or chemicals to your bath water, after you have permission to take baths. °· While passing urine, pour water from a bottle or spray over your vulva to dilute the urine as it passes the incision (this will decrease burning and discomfort). °· Clean yourself well after moving your bowels. °· After urinating, do not wipe. Dap or pat dry with toilet paper or a dry cleath soft cloth. °· A sitz bath will help keep your perineal area clean, reduce swelling, and provide comfort. °· Avoid wearing underpants for the first 2 weeks and wear loose skirts to   to allow circulation of air around the incision °· You do not need to apply dressings, salves or lotions to the wound. °· The stitches are self-dissolving and will absorb and disappear over a couple of months (it is normal to notice the knot from the stitches on toilet paper after voiding). °HOME CARE INSTRUCTIONS  °· Apply a soft ice pack (or frozen bag of peas) to your perineum (vulva) every hour in the first 48 hours after surgery. This  will reduce swelling. °· Avoid activities that involve a lot of friction between your legs. °· Avoid wearing pants or underpants in the 1st 2 weeks (skirts are preferable). °· Take your temperature twice a day and record it, especially if you feel feverish or have chills. °· Follow your caregiver's instructions about medicines, activity, and follow-up appointments after surgery. °· Do not drink alcohol while taking pain medicine. °· Change your dressing as advised by your caregiver. °· You may take over-the-counter medicine for pain, recommended by your caregiver. °· If your pain is not relieved with medicine, call your caregiver. °· Do not take aspirin because it can cause bleeding. °· Do not douche or use tampons (use a nonperfumed sanitary pad). °· Do not have sexual intercourse until your caregiver gives you permission (typically 6 weeks postoperatively). Hugging, kissing, and playful sexual activity is fine with your caregiver's permission. °· Warm sitz baths, with your caregiver's permission, are helpful to control swelling and discomfort. °· Take showers instead of baths, until your caregiver gives you permission to take baths. °· You may take a mild medicine for constipation, recommended by your caregiver. Bran foods and drinking a lot of fluids will help with constipation. °· Make sure your family understands everything about your operation and recovery. °SEEK MEDICAL CARE IF:  °· You notice swelling and redness around the wound area. °· You notice a foul smell coming from the wound or on the surgical dressing. °· You notice the wound is separating. °· You have painful or bloody urination. °· You develop nausea and vomiting. °· You develop diarrhea. °· You develop a rash. °· You have a reaction or allergy from the medicine. °· You feel dizzy or light-headed. °· You need stronger pain medicine. °SEEK IMMEDIATE MEDICAL CARE IF:  °· You develop a temperature of 102° F (38.9° C) or higher. °· You pass  out. °· You develop leg or chest pain. °· You develop abdominal pain. °· You develop shortness of breath. °· You develop bleeding from the wound area. °· You see pus in the wound area. °MAKE SURE YOU:  °· Understand these instructions. °· Will watch your condition. °· Will get help right away if you are not doing well or get worse. °Document Released: 01/14/2004 Document Revised: 10/16/2013 Document Reviewed: 05/03/2009 °Exit[employer]® Patient Information ©2015 [employer]. This information is not intended to replace advice given to you by your health care provider. Make sure you discuss any questions you have with your health care provider. °

## 2015-12-18 NOTE — Anesthesia Postprocedure Evaluation (Signed)
Anesthesia Post Note  Patient: Andrea Gomez  Procedure(s) Performed: Procedure(s) (LRB): WIDE EXCISION OF LEFT ANTERIOR VULVA (Left)  Patient location during evaluation: PACU Anesthesia Type: General Level of consciousness: sedated and patient cooperative Pain management: pain level controlled Vital Signs Assessment: post-procedure vital signs reviewed and stable Respiratory status: spontaneous breathing Cardiovascular status: stable Anesthetic complications: no    Last Vitals:  Filed Vitals:   12/18/15 1056 12/18/15 1209  BP: 153/81 153/86  Pulse: 71 96  Temp: 36.4 C 36.4 C  Resp: 12 14    Last Pain:  Filed Vitals:   12/18/15 1209  PainSc: [place]

## 2015-12-18 NOTE — Op Note (Signed)
PATIENT: Andrea Gomez DATE: 12/18/15   Preop Diagnosis: vulvar cancer and VIN3  Postoperative Diagnosis: same  Surgery: Partial simple left anterior vulvectomy  Surgeons:  Donaciano Eva, MD Assistant: none  Anesthesia: General   Estimated blood loss: <68ml  IVF:  276ml   Urine output: 50 ml   Complications: None   Pathology: left anterior labia minora with long marking stitch at 12 o'clock and short marking stitch at clitoral margin  Operative findings: 6cm patch of erythematous and acetowhite changes on left anterior labia minora abutting and involving left clitoral hood and extending posteriorally along labia minora and interlabial fold.  Procedure: The patient was identified in the preoperative holding area. Informed consent was signed on the chart. Patient was seen history was reviewed and exam was performed.   The patient was then taken to the operating room and placed in the supine position with SCD hose on. General anesthesia was then induced without difficulty. She was then placed in the dorsolithotomy position. The perineum was prepped with Betadine. The vagina was prepped with Betadine. The patient was then draped after the prep was dried. A Foley catheter was inserted into the bladder under sterile conditions.  Timeout was performed the patient, procedure, antibiotic, allergy, and length of procedure. 5% acetic acid solution was applied to the perineum. The vulvar tissues were inspected for areas of acetowhite changes or leukoplakia. The lesion was identified and the marking pen was used to circumscribe the area with appropriate surgical margins. The subcuticular tissues were infiltrated with 1% lidocaine. The 15 blade scalpel was used to make an incision through the skin circumferentially as marked. The skin elipse was grasped and was separated from the underlying deep dermal tissues with the bovie device. After the specimen had been completely resected, it  was oriented and marked at 12 o'clock with a long 0-vicryl suture and at the medial clitoral margin with a short suture. The bovie was used to obtain hemostasis at the surgical bed. The subcutaneous tissues were irrigated and made hemostatic.   The deep dermal layer was approximated with 3-0vicryl mattress sutures to bring the skin edges into approximation and off tension. The wound was closed following langher's lines. The cutaneous layer was closed with interrupted 4-0 vicryl stitches and mattress sutures to ensure a tension free and hemostatic closure. The perineum was again irrigated. The foley was removed.  All instrument, suture, laparotomy, Ray-Tec, and needle counts were correct x2. The patient tolerated the procedure well and was taken recovery room in stable condition. This is Andrea Gomez dictating an operative note on Andrea Gomez.  Donaciano Eva, MD

## 2015-12-18 NOTE — Interval H&P Note (Signed)
History and Physical Interval Note:  12/18/2015 8:25 AM  Andrea Gomez  has presented today for surgery, with the diagnosis of VULVAR CANCER  The various methods of treatment have been discussed with the patient and family. After consideration of risks, benefits and other options for treatment, the patient has consented to  Procedure(s): WIDE EXCISION OF LEFT ANTERIOR VULVA (Left) as a surgical intervention .  The patient's history has been reviewed, patient examined, no change in status, stable for surgery.  I have reviewed the patient's chart and labs.  Questions were answered to the patient's satisfaction.     Donaciano Eva

## 2015-12-18 NOTE — Transfer of Care (Signed)
Immediate Anesthesia Transfer of Care Note  Patient: Andrea Gomez  Procedure(s) Performed: Procedure(s): WIDE EXCISION OF LEFT ANTERIOR VULVA (Left)  Patient Location: PACU  Anesthesia Type:General  Level of Consciousness: awake, alert , oriented and patient cooperative  Airway & Oxygen Therapy: Patient Spontanous Breathing and Patient connected to nasal cannula oxygen  Post-op Assessment: Report given to RN and Post -op Vital signs reviewed and stable  Post vital signs: Reviewed and stable  Last Vitals:  Filed Vitals:   12/18/15 0634  BP: 124/78  Pulse: 88  Temp: 36.9 C  Resp: 16    Last Pain: There were no vitals filed for this visit.    Patients Stated Pain Goal: 4 ([id number])  Complications: No apparent anesthesia complications

## 2015-12-18 NOTE — Anesthesia Procedure Notes (Signed)
Procedure Name: LMA Insertion Date/Time: 12/18/2015 8:34 AM Performed by: Wanita Chamberlain Pre-anesthesia Checklist: Patient identified, Timeout performed, Emergency Drugs available, Suction available and Patient being monitored Patient Re-evaluated:Patient Re-evaluated prior to inductionOxygen Delivery Method: Circle system utilized Preoxygenation: Pre-oxygenation with 100% oxygen Intubation Type: IV induction Ventilation: Mask ventilation without difficulty LMA: LMA inserted LMA Size: 4.0 Number of attempts: 1 Placement Confirmation: positive ETCO2 Tube secured with: Tape Dental Injury: Teeth and Oropharynx as per pre-operative assessment

## 2015-12-20 ENCOUNTER — Telehealth: Payer: Self-pay | Admitting: Gynecologic Oncology

## 2015-12-20 NOTE — Telephone Encounter (Signed)
Contacted to speak with patient about pathology results.  Patient has stage IA vulvar cancer. Negative margins, but lateral margin positive for VIN3. No further surgical management necessary at this time.  Patient was "resting" and I spoke with her husband. I told him I was calling to relay her pathology results from her surgery. He said I should "call back later". I stated that because it was late on Friday afternoon, I would not be able to call back again until Monday.  He stated "that'll be all right". I mentioned to him that he should inform his wife that if she would like to know her pathology before Monday she should call me back within the next 5 minutes.  Donaciano Eva, MD

## 2015-12-25 ENCOUNTER — Ambulatory Visit: Payer: PPO | Attending: Gynecologic Oncology | Admitting: Gynecologic Oncology

## 2015-12-25 ENCOUNTER — Encounter: Payer: Self-pay | Admitting: Gynecologic Oncology

## 2015-12-25 VITALS — BP 151/68 | HR 110 | Temp 97.7°F | Resp 18 | Ht 63.0 in | Wt 163.3 lb

## 2015-12-25 DIAGNOSIS — Z8544 Personal history of malignant neoplasm of other female genital organs: Secondary | ICD-10-CM | POA: Insufficient documentation

## 2015-12-25 DIAGNOSIS — Z90710 Acquired absence of both cervix and uterus: Secondary | ICD-10-CM | POA: Insufficient documentation

## 2015-12-25 DIAGNOSIS — Z87891 Personal history of nicotine dependence: Secondary | ICD-10-CM | POA: Diagnosis not present

## 2015-12-25 DIAGNOSIS — C519 Malignant neoplasm of vulva, unspecified: Secondary | ICD-10-CM | POA: Diagnosis not present

## 2015-12-25 DIAGNOSIS — F419 Anxiety disorder, unspecified: Secondary | ICD-10-CM | POA: Diagnosis not present

## 2015-12-25 DIAGNOSIS — Z87410 Personal history of cervical dysplasia: Secondary | ICD-10-CM | POA: Insufficient documentation

## 2015-12-25 DIAGNOSIS — Z87440 Personal history of urinary (tract) infections: Secondary | ICD-10-CM | POA: Insufficient documentation

## 2015-12-25 DIAGNOSIS — Z9889 Other specified postprocedural states: Secondary | ICD-10-CM | POA: Diagnosis not present

## 2015-12-25 NOTE — Progress Notes (Signed)
Follow-up Note: Gyn-Onc  Second opinion by  Andrea Gomez 75 y.o. female with vulvar carcinoma  CC:  Chief Complaint  Patient presents with  . Routine Post Op    Assessment/Plan:  [name]  is a 75 y.o.  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  Healing normally. Will verify margin status with pathologist.  Recommend close follow-up with re-evaluation in 3 months. Discussed high risk for recurrence and notified her to contact me if she develops these symptoms.  HPI: Andrea Gomez is a 75 year old woman who is seen as a second opinion for vulvar cancer. The patient was seen by NP Andrea Gomez in April 2017 for annual surveillance visit. She reported the symptom of vulvar pruritus at this time and therefore was evaluated. Several lesions on the left and anterior vulva were appreciated and were biopsied. A biopsy marked vulvar biopsy red upper was resulted as high-grade vulvar intraepithelial neoplasia, VIN 3, with focal microscopic invasive squamous cell carcinoma. The a.m. 3 was also identified and all other sites of biopsies which included the left inner the left outer, a left black spot, and white upper. The specific anatomic location of these biopsy sites was not available.  The patient was seen in consultation by Dr. Benjamine Gomez skin and [facility] on 10/25/2015 and was recommended to undergo an excisional procedure of the vulva to determine the depth of invasion and the need for lymphadenectomy and possible radical vulvectomy. The patient was concerned about healing and to formally following such a surgery and spent a period of time evaluating online literature. She had done some reading which showed that it was [facility] office minimally invasive surgery approaches such as laparoscopy and was wondering if this was appropriate for her.  The patient has a remote history of HPV related  cervical dysplasia and, approximately 40 years ago, underwent a total hysterectomy for this. She had routine Surveillance in the intervening years and denies being HPV positive during that time.  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 She is a nonsmoker.   Interval Hx: On 12/18/15 she went to the OR for a wide local excision. The lesion abutted and involved the left clitoral hood which was excised. The lateral margin appeared grossly wide from the lesion.  Final pathology revealed high grade vulvar intraepithelial neoplasia, suspicious for focal invasion, with <0.18mm invasion. Positive margins "11-8 oclock" which represents the lateral margin.  Current Meds:  Outpatient Encounter Prescriptions as of 12/25/2015  Medication Sig  . DULoxetine (CYMBALTA) 60 MG capsule Take 60 mg by mouth 2 (two) times daily.  Marland Kitchen loratadine (ALLERGY RELIEF) 10 MG tablet Take 10 mg by mouth daily as needed for allergies or itching.  . nabumetone (RELAFEN) 750 MG tablet Take 750 mg by mouth 2 (two) times daily as needed for mild pain or moderate pain. Reported on 12/25/2015  . triamcinolone cream (KENALOG) 0.1 % apply to affected area twice a day sparingly  prn  . cyclobenzaprine (FLEXERIL) 10 MG tablet Take 10 mg by mouth at bedtime as needed for muscle spasms. Reported on 12/25/2015  . docusate sodium (COLACE) 100 MG capsule Take 1 capsule (100 mg total) by mouth 2 (two) times daily. (Patient not taking: Reported on 12/25/2015)  . HYDROcodone-acetaminophen (NORCO/VICODIN) 5-325 MG tablet Take 1 tablet by mouth every 6 (six) hours as needed for moderate pain. (Patient not  taking: Reported on 12/25/2015)  . OVER THE COUNTER MEDICATION Take 1 packet by mouth daily. Reported on 12/25/2015   No facility-administered encounter medications on file as of 12/25/2015.    Allergy: No Known Allergies  Social Hx:   Social History   Social History  . Marital Status: Married    Spouse Name:  N/A  . Number of Children: N/A  . Years of Education: N/A   Occupational History  . Not on file.   Social History Main Topics  . Smoking status: Former Smoker -- 2.00 packs/day for 20 years    Types: Cigarettes    Quit date: 12/09/1978  . Smokeless tobacco: Never Used  . Alcohol Use: No  . Drug Use: No  . Sexual Activity: Yes    Birth Control/ Protection: Surgical   Other Topics Concern  . Not on file   Social History Narrative    Past Surgical Hx:  Past Surgical History  Procedure Laterality Date  . Colonoscopy  05-03-2008  . Tonsillectomy  as teen  . Tendon repair left wrist in[place]  age 57  . Vaginal hysterectomy  1970's  . Vulvectomy Left 12/18/2015    Procedure: WIDE EXCISION OF LEFT ANTERIOR VULVA;  Surgeon: Andrea Amber, MD;  Location: [facility];  Service: Gynecology;  Laterality: Left;    Past Medical Hx:  Past Medical History  Diagnosis Date  . Anxiety   . History of cervical dysplasia     1970's  dx CIN/ HPV  s/p  TAH  . Vulva cancer ([place])     Stage 1A or 1B  Squamous Cell Carcinoma  . Wears glasses   . History of bronchitis   . History of urinary tract infection     Past Gynecological History:  Hysterectomy for CIN  No LMP recorded. Patient has had a hysterectomy.  Family Hx: History reviewed. No pertinent family history.  Review of Systems:  Constitutional  Feels well,    ENT Normal appearing ears and nares bilaterally Skin/Breast  No rash, sores, jaundice, itching, dryness Cardiovascular  No chest pain, shortness of breath, or edema  Pulmonary  No cough or wheeze.  Gastro Intestinal  No nausea, vomitting, or diarrhoea. No bright red blood per rectum, no abdominal pain, change in bowel movement, or constipation.  Genito Urinary  No frequency, urgency, dysuria,  Musculo Skeletal  No myalgia, arthralgia, joint swelling or pain  Neurologic  No weakness, numbness, change in gait,  Psychology  No depression, anxiety, insomnia.   Vitals:  Blood  pressure 151/68, pulse 110, temperature 97.7 F (36.5 C), temperature source Oral, resp. rate 18, height 5\' 3"  (1.6 m), weight 163 lb 4.8 oz (74.072 kg), SpO2 100 %.  Physical Exam: WD in NAD Neck  Supple NROM, without any enlargements.  Lymph Node Survey No cervical supraclavicular or inguinal adenopathy Cardiovascular  Pulse normal rate, regularity and rhythm. S1 and S2 normal.  Lungs  Clear to auscultation bilateraly, without wheezes/crackles/rhonchi. Good air movement.  Skin  No rash/lesions/breakdown  Psychiatry  Alert and oriented to person, place, and time  Abdomen  Normoactive bowel sounds, abdomen soft, non-tender and obese without evidence of hernia.  Back No CVA tenderness Genito Urinary  Vulva/vagina: Incision on left lateral anterior vulva and clitoris healing normally. No signs of infection.  Bladder/urethra:  No lesions or masses, well supported bladder  Vagina: normal  Cervix and uterus: surgically absent  Adnexa: no masses. Rectal  deferred  Extremities  No bilateral cyanosis, clubbing or edema.  Donaciano Eva, MD  12/25/2015, 4:49 PM

## 2016-01-06 ENCOUNTER — Ambulatory Visit: Payer: PPO | Admitting: Gynecologic Oncology

## 2016-01-08 ENCOUNTER — Ambulatory Visit: Payer: PPO | Admitting: Gynecologic Oncology

## 2016-02-10 ENCOUNTER — Telehealth: Payer: Self-pay | Admitting: Gynecologic Oncology

## 2016-02-10 NOTE — Telephone Encounter (Signed)
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  This is the second time she has experienced this.  She states she was given valtrex in the past by her PCP but by the time the starts taking it, it is gone.  Patient not wanting to come in at this time.  Wanting to adjust her appt in October because she will be out of town.  Advised patient to keep [place] posted on how she was doing and to call if the area does not begin to improve.  All questions answered.  Advised to call for any needs.

## 2016-03-09 ENCOUNTER — Other Ambulatory Visit: Payer: Self-pay | Admitting: Family Medicine

## 2016-03-09 ENCOUNTER — Ambulatory Visit
Admission: RE | Admit: 2016-03-09 | Discharge: 2016-03-09 | Disposition: A | Discharge status: Home / Self Care | Payer: PPO | Source: Ambulatory Visit | Attending: Family Medicine | Admitting: Family Medicine

## 2016-03-09 DIAGNOSIS — S8001XA Contusion of right knee, initial encounter: Secondary | ICD-10-CM

## 2016-03-09 DIAGNOSIS — M25561 Pain in right knee: Secondary | ICD-10-CM | POA: Diagnosis not present

## 2016-03-09 DIAGNOSIS — E559 Vitamin D deficiency, unspecified: Secondary | ICD-10-CM | POA: Diagnosis not present

## 2016-03-09 DIAGNOSIS — E7800 Pure hypercholesterolemia, unspecified: Secondary | ICD-10-CM | POA: Diagnosis not present

## 2016-03-23 DIAGNOSIS — M25561 Pain in right knee: Secondary | ICD-10-CM | POA: Diagnosis not present

## 2016-03-23 DIAGNOSIS — Z23 Encounter for immunization: Secondary | ICD-10-CM | POA: Diagnosis not present

## 2016-03-23 DIAGNOSIS — F419 Anxiety disorder, unspecified: Secondary | ICD-10-CM | POA: Diagnosis not present

## 2016-03-31 NOTE — Progress Notes (Signed)
Follow-up Note: Gyn-Onc  Second opinion by  [name] 75 y.o. female with vulvar carcinoma  CC:  No chief complaint on file.   Assessment/Plan:  [name]  is a 75 y.o.  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  No evidence of disease on today's exam.  Vulvovaginal atrophy from menopause. Recommend vaginal premarin 3 times per week. Discussed the low potential for increased breast cancer risk (minimal absorption systemically with topical use).  Recommend close follow-up with re-evaluation in 3 months.  Discussed high risk for recurrence and notified her to contact me if she develops these symptoms.  HPI: Andrea Gomez is a 75 year old woman who is seen as a second opinion for vulvar cancer. The patient was seen by NP Auma Thongteum in April 2017 for annual surveillance visit. She reported the symptom of vulvar pruritus at this time and therefore was evaluated. Several lesions on the left and anterior vulva were appreciated and were biopsied. A biopsy marked vulvar biopsy red upper was resulted as high-grade vulvar intraepithelial neoplasia, VIN 3, with focal microscopic invasive squamous cell carcinoma. The a.m. 3 was also identified and all other sites of biopsies which included the left inner the left outer, a left black spot, and white upper. The specific anatomic location of these biopsy sites was not available.  The patient was seen in consultation by Dr. Benjamine Mola skin and [facility] on 10/25/2015 and was recommended to undergo an excisional procedure of the vulva to determine the depth of invasion and the need for lymphadenectomy and possible radical vulvectomy. The patient was concerned about healing and to formally following such a surgery and spent a period of time evaluating online literature. She had done some reading which showed that it  was [facility] office minimally invasive surgery approaches such as laparoscopy and was wondering if this was appropriate for her.  The patient has a remote history of HPV related cervical dysplasia and, approximately 40 years ago, underwent a total hysterectomy for this. She had routine Surveillance in the intervening years and denies being HPV positive during that time.  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 She is a nonsmoker.   Interval Hx: On 12/18/15 she went to the OR for a wide local excision. The lesion abutted and involved the left clitoral hood which was excised. The lateral margin appeared grossly wide from the lesion.  Final pathology revealed high grade vulvar intraepithelial neoplasia, suspicious for focal invasion, with <0.35mm invasion. Positive margins for VIN3 (not cancer) at  "8-11 oclock" which represents the medial/clitoral margin.  Reports no new lesions, but has deep dyspareunia due to vaginal dryness and atrophy.  Current Meds:  Outpatient Encounter Prescriptions as of 04/01/2016  Medication Sig  . cyclobenzaprine (FLEXERIL) 10 MG tablet Take 10 mg by mouth at bedtime as needed for muscle spasms. Reported on 12/25/2015  . docusate sodium (COLACE) 100 MG capsule Take 1 capsule (100 mg total) by mouth 2 (two) times daily. (Patient not taking: Reported on 12/25/2015)  . DULoxetine (CYMBALTA) 60 MG capsule Take 60 mg by mouth 2 (two) times daily.  Marland Kitchen HYDROcodone-acetaminophen (NORCO/VICODIN) 5-325 MG tablet Take 1 tablet by mouth every 6 (six) hours as needed for moderate pain. (Patient not taking: Reported on 12/25/2015)  . loratadine (ALLERGY RELIEF) 10 MG tablet Take 10 mg by mouth  daily as needed for allergies or itching.  . nabumetone (RELAFEN) 750 MG tablet Take 750 mg by mouth 2 (two) times daily as needed for mild pain or moderate pain. Reported on 12/25/2015  . OVER THE COUNTER MEDICATION Take 1 packet by mouth daily. Reported  on 12/25/2015  . triamcinolone cream (KENALOG) 0.1 % apply to affected area twice a day sparingly  prn   No facility-administered encounter medications on file as of 04/01/2016.     Allergy: No Known Allergies  Social Hx:   Social History   Social History  . Marital status: Married    Spouse name: N/A  . Number of children: N/A  . Years of education: N/A   Occupational History  . Not on file.   Social History Main Topics  . Smoking status: Former Smoker    Packs/day: 2.00    Years: 20.00    Types: Cigarettes    Quit date: 12/09/1978  . Smokeless tobacco: Never Used  . Alcohol use No  . Drug use: No  . Sexual activity: Yes    Birth control/ protection: Surgical   Other Topics Concern  . Not on file   Social History Narrative  . No narrative on file    Past Surgical Hx:  Past Surgical History:  Procedure Laterality Date  . COLONOSCOPY  05-03-2008  . TENDON REPAIR LEFT WRIST INURY  age 24  . TONSILLECTOMY  as teen  . VAGINAL HYSTERECTOMY  1970's  . VULVECTOMY Left 12/18/2015   Procedure: WIDE EXCISION OF LEFT ANTERIOR VULVA;  Surgeon: Everitt Amber, MD;  Location: [facility];  Service: Gynecology;  Laterality: Left;    Past Medical Hx:  Past Medical History:  Diagnosis Date  . Anxiety   . History of bronchitis   . 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 History of urinary tract infection   . Vulva cancer ([place])    Stage 1A or 1B  Squamous Cell Carcinoma  . Wears glasses     Past Gynecological History:  Hysterectomy for CIN  No LMP recorded. Patient has had a hysterectomy.  Family Hx: No family history on file.  Review of Systems:  Constitutional  Feels well,    ENT Normal appearing ears and nares bilaterally Skin/Breast  No rash, sores, jaundice, itching, dryness Cardiovascular  No chest pain, shortness of breath, or edema  Pulmonary  No cough or wheeze.  Gastro Intestinal  No nausea, vomitting, or diarrhoea. No bright red  blood per rectum, no abdominal pain, change in bowel movement, or constipation.  Genito Urinary  No frequency, urgency, dysuria,  Musculo Skeletal  No myalgia, arthralgia, joint swelling or pain  Neurologic  No weakness, numbness, change in gait,  Psychology  No depression, anxiety, insomnia.   Vitals:  There were no vitals taken for this visit.  Physical Exam: WD in NAD Neck  Supple NROM, without any enlargements.  Lymph Node Survey No cervical supraclavicular or inguinal adenopathy Cardiovascular  Pulse normal rate, regularity and rhythm. S1 and S2 normal.  Lungs  Clear to auscultation bilateraly, without wheezes/crackles/rhonchi. Good air movement.  Skin  No rash/lesions/breakdown  Psychiatry  Alert and oriented to person, place, and time  Abdomen  Normoactive bowel sounds, abdomen soft, non-tender and obese without evidence of hernia.  Back No CVA tenderness Genito Urinary  Vulva/vagina: Incision on left lateral anterior vulva and clitoris healing normally.   Bladder/urethra:  No lesions or masses, well  supported bladder  Vagina: atrophic, normal epithelium  Cervix and uterus: surgically absent  Adnexa: no masses. Rectal  deferred  Extremities  No bilateral cyanosis, clubbing or edema.   Donaciano Eva, MD  03/31/2016, 3:10 PM

## 2016-04-01 ENCOUNTER — Ambulatory Visit: Payer: PPO | Attending: Gynecologic Oncology | Admitting: Gynecologic Oncology

## 2016-04-01 ENCOUNTER — Encounter: Payer: Self-pay | Admitting: Gynecologic Oncology

## 2016-04-01 VITALS — BP 119/56 | HR 90 | Temp 98.3°F | Resp 18 | Ht 63.0 in | Wt 166.5 lb

## 2016-04-01 DIAGNOSIS — N9412 Deep dyspareunia: Secondary | ICD-10-CM | POA: Insufficient documentation

## 2016-04-01 DIAGNOSIS — C519 Malignant neoplasm of vulva, unspecified: Secondary | ICD-10-CM

## 2016-04-01 DIAGNOSIS — N952 Postmenopausal atrophic vaginitis: Secondary | ICD-10-CM | POA: Diagnosis not present

## 2016-04-01 DIAGNOSIS — Z79899 Other long term (current) drug therapy: Secondary | ICD-10-CM | POA: Diagnosis not present

## 2016-04-01 DIAGNOSIS — Z87891 Personal history of nicotine dependence: Secondary | ICD-10-CM | POA: Diagnosis not present

## 2016-04-01 DIAGNOSIS — Z8544 Personal history of malignant neoplasm of other female genital organs: Secondary | ICD-10-CM | POA: Diagnosis not present

## 2016-04-01 DIAGNOSIS — Z9889 Other specified postprocedural states: Secondary | ICD-10-CM | POA: Diagnosis not present

## 2016-04-01 MED ORDER — ESTROGENS, CONJUGATED 0.625 MG/GM VA CREA: 1.0000 | TOPICAL_CREAM | VAGINAL | 12 refills | Status: DC

## 2016-04-01 NOTE — Patient Instructions (Signed)
Plan to follow up with Dr. Denman George in six months or sooner if needed.  Begin using premarin vaginal cream three times a week.    Conjugated Estrogens vaginal cream What is this medicine? CONJUGATED ESTROGENS (CON ju gate ed ESS troe jenz) are a mixture of female hormones. This cream can help relieve symptoms associated with menopause.like vaginal dryness and irritation. This medicine may be used for other purposes; ask your health care provider or pharmacist if you have questions. What should I tell my health care provider before I take this medicine? They need to know if you have any of these conditions: -abnormal vaginal bleeding -blood vessel disease or blood clots -breast, cervical, endometrial, or uterine cancer -dementia -diabetes -gallbladder disease -heart disease or recent heart attack -high blood pressure -high cholesterol -high level of calcium in the blood -hysterectomy -kidney disease -liver disease -migraine headaches -protein C deficiency -protein S deficiency -stroke -systemic lupus erythematosus (SLE) -tobacco smoker -an unusual or allergic reaction to estrogens other medicines, foods, dyes, or preservatives -pregnant or trying to get pregnant -breast-feeding How should I use this medicine? This medicine is for use in the vagina only. Do not take by mouth. Follow the directions on the prescription label. Use at bedtime unless otherwise directed by your doctor or health care professional. Use the special applicator supplied with the cream. Wash hands before and after use. Fill the applicator with the cream and remove from the tube. Lie on your back, part and bend your knees. Insert the applicator into the vagina and push the plunger to expel the cream into the vagina. Wash the applicator with warm soapy water and rinse well. Use exactly as directed for the complete length of time prescribed. Do not stop using except on the advice of your doctor or health care  professional. Talk to your pediatrician regarding the use of this medicine in children. Special care may be needed. A patient package insert for the product will be given with each prescription and refill. Read this sheet carefully each time. The sheet may change frequently. Overdosage: If you think you have taken too much of this medicine contact a poison control center or emergency room at once. NOTE: This medicine is only for you. Do not share this medicine with others. What if I miss a dose? If you miss a dose, use it as soon as you can. If it is almost time for your next dose, use only that dose. Do not use double or extra doses. What may interact with this medicine? Do not take this medicine with any of the following medications: -aromatase inhibitors like aminoglutethimide, anastrozole, exemestane, letrozole, testolactone This medicine may also interact with the following medications: -barbiturates used for inducing sleep or treating seizures -carbamazepine -grapefruit juice -medicines for fungal infections like itraconazole and ketoconazole -raloxifene or tamoxifen -rifabutin -rifampin -rifapentine -ritonavir -some antibiotics used to treat infections -St. John's Wort -warfarin This list may not describe all possible interactions. Give your health care provider a list of all the medicines, herbs, non-prescription drugs, or dietary supplements you use. Also tell them if you smoke, drink alcohol, or use illegal drugs. Some items may interact with your medicine. What should I watch for while using this medicine? Visit your health care professional for regular checks on your progress. You will need a regular breast and pelvic exam. You should also discuss the need for regular mammograms with your health care professional, and follow his or her guidelines. This medicine can make your body retain  fluid, making your fingers, hands, or ankles swell. Your blood pressure can go up. Contact  your doctor or health care professional if you feel you are retaining fluid. If you have any reason to think you are pregnant; stop taking this medicine at once and contact your doctor or health care professional. Tobacco smoking increases the risk of getting a blood clot or having a stroke, especially if you are more than 74 years old. You are strongly advised not to smoke. If you wear contact lenses and notice visual changes, or if the lenses begin to feel uncomfortable, consult your eye care specialist. If you are going to have elective surgery, you may need to stop taking this medicine beforehand. Consult your health care professional for advice prior to scheduling the surgery. What side effects may I notice from receiving this medicine? Side effects that you should report to your doctor or health care professional as soon as possible: -allergic reactions like skin rash, itching or hives, swelling of the face, lips, or tongue -breast tissue changes or discharge -changes in vision -chest pain -confusion, trouble speaking or understanding -dark urine -general ill feeling or flu-like symptoms -light-colored stools -nausea, vomiting -pain, swelling, warmth in the leg -right upper belly pain -severe headaches -shortness of breath -sudden numbness or weakness of the face, arm or leg -trouble walking, dizziness, loss of balance or coordination -unusual vaginal bleeding -yellowing of the eyes or skin Side effects that usually do not require medical attention (report to your doctor or health care professional if they continue or are bothersome): -hair loss -increased hunger or thirst -increased urination -symptoms of vaginal infection like itching, irritation or unusual discharge -unusually weak or tired This list may not describe all possible side effects. Call your doctor for medical advice about side effects. You may report side effects to FDA at 1-800-FDA-1088. Where should I keep my  medicine? Keep out of the reach of children. Store at room temperature between 15 and 30 degrees C (59 and 86 degrees F). Throw away any unused medicine after the expiration date. NOTE: This sheet is a summary. It may not cover all possible information. If you have questions about this medicine, talk to your doctor, pharmacist, or health care provider.    2016, [employer] Standard. (2010-09-03 09:20:36)

## 2016-04-08 ENCOUNTER — Ambulatory Visit: Payer: PPO | Admitting: Gynecologic Oncology

## 2016-09-15 NOTE — Progress Notes (Signed)
Follow-up Note: Gyn-Onc  Second opinion by  [name] 76 y.o. female with vulvar carcinoma  CC:  Chief Complaint  Patient presents with  . Vulvar Cancer    Assessment/Plan:  Andrea Gomez  is a 76 y.o.  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  No evidence of disease on today's exam.  Two 83mm circular areas on right and midline hair baring mons pubis consistent with areas of herpetic outbreak  - will re-evaluate in 3 months.  Recommend close follow-up with re-evaluation in 3 months.  Discussed high risk for recurrence and notified her to contact me if she develops these symptoms.  HPI: Andrea Gomez is a 76 year old woman who is seen as a second opinion for vulvar cancer. The patient was seen by NP Auma Thongteum in April 2017 for annual surveillance visit. She reported the symptom of vulvar pruritus at this time and therefore was evaluated. Several lesions on the left and anterior vulva were appreciated and were biopsied. A biopsy marked vulvar biopsy red upper was resulted as high-grade vulvar intraepithelial neoplasia, VIN 3, with focal microscopic invasive squamous cell carcinoma. The a.m. 3 was also identified and all other sites of biopsies which included the left inner the left outer, a left black spot, and white upper. The specific anatomic location of these biopsy sites was not available.  The patient was seen in consultation by Dr. Benjamine Mola skin and [facility] on 10/25/2015 and was recommended to undergo an excisional procedure of the vulva to determine the depth of invasion and the need for lymphadenectomy and possible radical vulvectomy. The patient was concerned about healing and to formally following such a surgery and spent a period of time evaluating online literature. She had done some reading which showed that it was [facility]  office minimally invasive surgery approaches such as laparoscopy and was wondering if this was appropriate for her.  The patient has a remote history of HPV related cervical dysplasia and, approximately 40 years ago, underwent a total hysterectomy for this. She had routine Surveillance in the intervening years and denies being HPV positive during that time.  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 She is a nonsmoker.   On 12/18/15 she went to the OR for a wide local excision. The lesion abutted and involved the left clitoral hood which was excised. The lateral margin appeared grossly wide from the lesion.  Final pathology revealed high grade vulvar intraepithelial neoplasia, suspicious for focal invasion, with <0.75mm invasion. Positive margins for VIN3 (not cancer) at  "8-11 oclock" which represents the medial/clitoral margin.  Interval Hx:  Reports no new lesions, but has deep dyspareunia due to vaginal dryness.  She did report having a herpetic outbreak in February.  Current Meds:  Outpatient Encounter Prescriptions as of 09/16/2016  Medication Sig  . cyclobenzaprine (FLEXERIL) 10 MG tablet Take 10 mg by mouth at bedtime as needed for muscle spasms. Reported on 12/25/2015  . DULoxetine (CYMBALTA) 60 MG capsule Take 60 mg by mouth 2 (two) times daily.  . nabumetone (RELAFEN) 750 MG tablet Take 750 mg by mouth 2 (two) times daily as needed for mild pain or moderate pain. Reported on 12/25/2015  . triamcinolone cream (KENALOG) 0.1 % apply to affected area twice a day sparingly  prn  . [DISCONTINUED] conjugated estrogens (PREMARIN) vaginal cream  Place 1 Applicatorful vaginally 3 (three) times a week. (Patient not taking: Reported on 09/16/2016)  . [DISCONTINUED] loratadine (ALLERGY RELIEF) 10 MG tablet Take 10 mg by mouth daily as needed for allergies or itching.  . [DISCONTINUED] OVER THE COUNTER MEDICATION Take 1 packet by mouth daily. Reported on 12/25/2015    No facility-administered encounter medications on file as of 09/16/2016.     Allergy: No Known Allergies  Social Hx:   Social History   Social History  . Marital status: Married    Spouse name: N/A  . Number of children: N/A  . Years of education: N/A   Occupational History  . Not on file.   Social History Main Topics  . Smoking status: Former Smoker    Packs/day: 2.00    Years: 20.00    Types: Cigarettes    Quit date: 12/09/1978  . Smokeless tobacco: Never Used  . Alcohol use No  . Drug use: No  . Sexual activity: Yes    Birth control/ protection: Surgical   Other Topics Concern  . Not on file   Social History Narrative  . No narrative on file    Past Surgical Hx:  Past Surgical History:  Procedure Laterality Date  . COLONOSCOPY  05-03-2008  . TENDON REPAIR LEFT WRIST INURY  age 8  . TONSILLECTOMY  as teen  . VAGINAL HYSTERECTOMY  1970's  . VULVECTOMY Left 12/18/2015   Procedure: WIDE EXCISION OF LEFT ANTERIOR VULVA;  Surgeon: Everitt Amber, MD;  Location: [facility];  Service: Gynecology;  Laterality: Left;    Past Medical Hx:  Past Medical History:  Diagnosis Date  . Anxiety   . History of bronchitis   . 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 History of urinary tract infection   . 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 Wears glasses     Past Gynecological History:  Hysterectomy for CIN  No LMP recorded. Patient has had a hysterectomy.  Family Hx: History reviewed. No pertinent family history.  Review of Systems:  Constitutional  Feels well,    ENT Normal appearing ears and nares bilaterally Skin/Breast  No rash, sores, jaundice, itching, dryness Cardiovascular  No chest pain, shortness of breath, or edema  Pulmonary  No cough or wheeze.  Gastro Intestinal  No nausea, vomitting, or diarrhoea. No bright red blood per rectum, no abdominal pain, change in bowel movement, or constipation.  Genito  Urinary  No frequency, urgency, dysuria,  Musculo Skeletal  No myalgia, arthralgia, joint swelling or pain  Neurologic  No weakness, numbness, change in gait,  Psychology  No depression, anxiety, insomnia.   Vitals:  Blood pressure (!) 165/69, pulse (!) 102, temperature 97.8 F (36.6 C), resp. rate 20, weight 169 lb 11.2 oz (77 kg).  Physical Exam: WD in NAD Neck  Supple NROM, without any enlargements.  Lymph Node Survey No cervical supraclavicular or inguinal adenopathy Cardiovascular  Pulse normal rate, regularity and rhythm. S1 and S2 normal.  Lungs  Clear to auscultation bilateraly, without wheezes/crackles/rhonchi. Good air movement.  Skin  No rash/lesions/breakdown  Psychiatry  Alert and oriented to person, place, and time  Abdomen  Normoactive bowel sounds, abdomen soft, non-tender and obese without evidence of hernia.  Back No CVA tenderness Genito Urinary  Vulva/vagina: Incision on left lateral anterior vulva and clitoris healed. There is a 53mm circular lesion in the hair-bearing  mons pubis region on the right and another more midline, most consistent with post-herpetic healing lesions. Acetic acid applied. No acetowhite areas identified.  Bladder/urethra:  No lesions or masses, well supported bladder  Vagina: atrophic, normal epithelium  Cervix and uterus: surgically absent  Adnexa: no masses. Rectal  deferred  Extremities  No bilateral cyanosis, clubbing or edema.   Donaciano Eva, MD  09/16/2016, 1:45 PM

## 2016-09-16 ENCOUNTER — Encounter: Payer: Self-pay | Admitting: Gynecologic Oncology

## 2016-09-16 ENCOUNTER — Ambulatory Visit: Payer: PPO | Attending: Gynecologic Oncology | Admitting: Gynecologic Oncology

## 2016-09-16 VITALS — BP 165/69 | HR 102 | Temp 97.8°F | Resp 20 | Wt 169.7 lb

## 2016-09-16 DIAGNOSIS — Z90710 Acquired absence of both cervix and uterus: Secondary | ICD-10-CM | POA: Insufficient documentation

## 2016-09-16 DIAGNOSIS — F419 Anxiety disorder, unspecified: Secondary | ICD-10-CM | POA: Diagnosis not present

## 2016-09-16 DIAGNOSIS — Z87891 Personal history of nicotine dependence: Secondary | ICD-10-CM | POA: Diagnosis not present

## 2016-09-16 DIAGNOSIS — C519 Malignant neoplasm of vulva, unspecified: Secondary | ICD-10-CM

## 2016-09-16 DIAGNOSIS — Z8544 Personal history of malignant neoplasm of other female genital organs: Secondary | ICD-10-CM | POA: Diagnosis not present

## 2016-09-16 NOTE — Patient Instructions (Signed)
Dr. Denman George wants to see you in July 2018 for follow up.  Please call for appointment in the beginning of May.  [phone]. If any questions or concerns, please call to be seen earlier then July.

## 2016-09-30 DIAGNOSIS — E7800 Pure hypercholesterolemia, unspecified: Secondary | ICD-10-CM | POA: Diagnosis not present

## 2016-10-07 DIAGNOSIS — M545 Low back pain: Secondary | ICD-10-CM | POA: Diagnosis not present

## 2016-10-07 DIAGNOSIS — F419 Anxiety disorder, unspecified: Secondary | ICD-10-CM | POA: Diagnosis not present

## 2016-10-07 DIAGNOSIS — L230 Allergic contact dermatitis due to metals: Secondary | ICD-10-CM | POA: Diagnosis not present

## 2016-10-07 DIAGNOSIS — Z1389 Encounter for screening for other disorder: Secondary | ICD-10-CM | POA: Diagnosis not present

## 2016-10-07 DIAGNOSIS — C519 Malignant neoplasm of vulva, unspecified: Secondary | ICD-10-CM | POA: Diagnosis not present

## 2016-10-07 DIAGNOSIS — Z0000 Encounter for general adult medical examination without abnormal findings: Secondary | ICD-10-CM | POA: Diagnosis not present

## 2016-10-07 DIAGNOSIS — E7800 Pure hypercholesterolemia, unspecified: Secondary | ICD-10-CM | POA: Diagnosis not present

## 2016-10-07 DIAGNOSIS — M8580 Other specified disorders of bone density and structure, unspecified site: Secondary | ICD-10-CM | POA: Diagnosis not present

## 2016-11-26 ENCOUNTER — Telehealth: Payer: Self-pay | Admitting: *Deleted

## 2016-11-26 NOTE — Telephone Encounter (Signed)
Returned the patient's call and scheduled appt for July 9th. Patient aware of the date/time

## 2016-12-21 ENCOUNTER — Ambulatory Visit: Payer: PPO | Attending: Gynecologic Oncology | Admitting: Gynecologic Oncology

## 2016-12-21 ENCOUNTER — Encounter: Payer: Self-pay | Admitting: Gynecologic Oncology

## 2016-12-21 VITALS — BP 161/76 | HR 92 | Temp 98.1°F | Resp 20 | Wt 168.4 lb

## 2016-12-21 DIAGNOSIS — B3731 Acute candidiasis of vulva and vagina: Secondary | ICD-10-CM | POA: Insufficient documentation

## 2016-12-21 DIAGNOSIS — C519 Malignant neoplasm of vulva, unspecified: Secondary | ICD-10-CM | POA: Diagnosis not present

## 2016-12-21 DIAGNOSIS — F419 Anxiety disorder, unspecified: Secondary | ICD-10-CM | POA: Diagnosis not present

## 2016-12-21 DIAGNOSIS — Z8544 Personal history of malignant neoplasm of other female genital organs: Secondary | ICD-10-CM | POA: Diagnosis not present

## 2016-12-21 DIAGNOSIS — B373 Candidiasis of vulva and vagina: Secondary | ICD-10-CM | POA: Diagnosis not present

## 2016-12-21 DIAGNOSIS — Z87891 Personal history of nicotine dependence: Secondary | ICD-10-CM | POA: Diagnosis not present

## 2016-12-21 MED ORDER — NYSTATIN 100000 UNIT/GM EX POWD: Freq: Three times a day (TID) | CUTANEOUS | 0 refills | Status: DC

## 2016-12-21 NOTE — Patient Instructions (Signed)
Call the office in November/December to schedule your follow up with Dr. Denman George in January. Call the office if you have any concerns.

## 2016-12-21 NOTE — Progress Notes (Signed)
Follow-up Note: Gyn-Onc  Second opinion by  Andrea Gomez 76 y.o. female with vulvar carcinoma  CC:  Chief Complaint  Patient presents with  . Vulvar cancer [facility])    Assessment/Plan:  [name]  is a 76 y.o.  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  No evidence of disease on today's exam.  Candida/yeast in hair bearing groin and mons - prescribe nystatin.  Recommend close follow-up with re-evaluation in 6 months.  Discussed high risk for recurrence and notified her to contact me if she develops these symptoms.  HPI: Andrea Gomez is a 76 year old woman who is seen as a second opinion for vulvar cancer. The patient was seen by NP Auma Thongteum in April 2017 for annual surveillance visit. She reported the symptom of vulvar pruritus at this time and therefore was evaluated. Several lesions on the left and anterior vulva were appreciated and were biopsied. A biopsy marked vulvar biopsy red upper was resulted as high-grade vulvar intraepithelial neoplasia, VIN 3, with focal microscopic invasive squamous cell carcinoma. The a.m. 3 was also identified and all other sites of biopsies which included the left inner the left outer, a left black spot, and white upper. The specific anatomic location of these biopsy sites was not available.  The patient was seen in consultation by Dr. Benjamine Mola skin and [facility] on 10/25/2015 and was recommended to undergo an excisional procedure of the vulva to determine the depth of invasion and the need for lymphadenectomy and possible radical vulvectomy. The patient was concerned about healing and to formally following such a surgery and spent a period of time evaluating online literature. She had done some reading which showed that it was [facility] office minimally invasive surgery approaches such as laparoscopy and was  wondering if this was appropriate for her.  The patient has a remote history of HPV related cervical dysplasia and, approximately 40 years ago, underwent a total hysterectomy for this. She had routine Surveillance in the intervening years and denies being HPV positive during that time.  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 She is a nonsmoker.   On 12/18/15 she went to the OR for a wide local excision. The lesion abutted and involved the left clitoral hood which was excised. The lateral margin appeared grossly wide from the lesion.  Final pathology revealed high grade vulvar intraepithelial neoplasia, suspicious for focal invasion, with <0.28mm invasion. Positive margins for VIN3 (not cancer) at  "8-11 oclock" which represents the medial/clitoral margin.  Interval Hx:  Reports no new lesions, but has pruritis in groin creases.  Current Meds:  Outpatient Encounter Prescriptions as of 12/21/2016  Medication Sig  . cyclobenzaprine (FLEXERIL) 10 MG tablet Take 10 mg by mouth at bedtime as needed for muscle spasms. Reported on 12/25/2015  . DULoxetine (CYMBALTA) 60 MG capsule Take 60 mg by mouth 2 (two) times daily.  . nabumetone (RELAFEN) 750 MG tablet Take 750 mg by mouth 2 (two) times daily as needed for mild pain or moderate pain. Reported on 12/25/2015  . triamcinolone cream (KENALOG) 0.1 % apply to affected area twice a day sparingly  prn   No facility-administered encounter medications on file as of 12/21/2016.     Allergy: No Known Allergies  Social Hx:   Social History   Social History  . Marital status:  Married    Spouse name: N/A  . Number of children: N/A  . Years of education: N/A   Occupational History  . Not on file.   Social History Main Topics  . Smoking status: Former Smoker    Packs/day: 2.00    Years: 20.00    Types: Cigarettes    Quit date: 12/09/1978  . Smokeless tobacco: Never Used  . Alcohol use No  . Drug use: No  .  Sexual activity: Yes    Birth control/ protection: Surgical   Other Topics Concern  . Not on file   Social History Narrative  . No narrative on file    Past Surgical Hx:  Past Surgical History:  Procedure Laterality Date  . COLONOSCOPY  05-03-2008  . TENDON REPAIR LEFT WRIST INURY  age 76  . TONSILLECTOMY  as teen  . VAGINAL HYSTERECTOMY  1970's  . VULVECTOMY Left 12/18/2015   Procedure: WIDE EXCISION OF LEFT ANTERIOR VULVA;  Surgeon: Everitt Amber, MD;  Location: [facility];  Service: Gynecology;  Laterality: Left;    Past Medical Hx:  Past Medical History:  Diagnosis Date  . Anxiety   . History of bronchitis   . 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 History of urinary tract infection   . 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 Wears glasses     Past Gynecological History:  Hysterectomy for CIN  No LMP recorded. Patient has had a hysterectomy.  Family Hx: History reviewed. No pertinent family history.  Review of Systems:  Constitutional  Feels well,    ENT Normal appearing ears and nares bilaterally Skin/Breast  No rash, sores, jaundice, itching, dryness Cardiovascular  No chest pain, shortness of breath, or edema  Pulmonary  No cough or wheeze.  Gastro Intestinal  No nausea, vomitting, or diarrhoea. No bright red blood per rectum, no abdominal pain, change in bowel movement, or constipation.  Genito Urinary  No frequency, urgency, dysuria,  Musculo Skeletal  No myalgia, arthralgia, joint swelling or pain  Neurologic  No weakness, numbness, change in gait,  Psychology  No depression, anxiety, insomnia.   Vitals:  Blood pressure (!) 161/76, pulse 92, temperature 98.1 F (36.7 C), temperature source Oral, resp. rate 20, weight 168 lb 6.4 oz (76.4 kg), SpO2 96 %.  Physical Exam: WD in NAD Neck  Supple NROM, without any enlargements.  Lymph Node Survey No cervical supraclavicular or inguinal  adenopathy Cardiovascular  Pulse normal rate, regularity and rhythm. S1 and S2 normal.  Lungs  Clear to auscultation bilateraly, without wheezes/crackles/rhonchi. Good air movement.  Skin  No rash/lesions/breakdown  Psychiatry  Alert and oriented to person, place, and time  Abdomen  Normoactive bowel sounds, abdomen soft, non-tender and obese without evidence of hernia.  Back No CVA tenderness Genito Urinary  Vulva/vagina: Incision on left lateral anterior vulva and clitoris healed. Acetic acid applied. No acetowhite areas identified. Tinea/candida seen in groin and mons.  Bladder/urethra:  No lesions or masses, well supported bladder  Vagina: atrophic, normal epithelium   Cervix and uterus: surgically absent  Adnexa: no masses. Rectal  deferred  Extremities  No bilateral cyanosis, clubbing or edema.   Donaciano Eva, MD  12/21/2016, 3:14 PM

## 2017-04-08 DIAGNOSIS — F419 Anxiety disorder, unspecified: Secondary | ICD-10-CM | POA: Diagnosis not present

## 2017-04-08 DIAGNOSIS — M545 Low back pain: Secondary | ICD-10-CM | POA: Diagnosis not present

## 2017-04-08 DIAGNOSIS — M8580 Other specified disorders of bone density and structure, unspecified site: Secondary | ICD-10-CM | POA: Diagnosis not present

## 2017-04-08 DIAGNOSIS — E7800 Pure hypercholesterolemia, unspecified: Secondary | ICD-10-CM | POA: Diagnosis not present

## 2017-04-08 DIAGNOSIS — E559 Vitamin D deficiency, unspecified: Secondary | ICD-10-CM | POA: Diagnosis not present

## 2017-04-08 DIAGNOSIS — R030 Elevated blood-pressure reading, without diagnosis of hypertension: Secondary | ICD-10-CM | POA: Diagnosis not present

## 2017-04-27 DIAGNOSIS — H353130 Nonexudative age-related macular degeneration, bilateral, stage unspecified: Secondary | ICD-10-CM | POA: Diagnosis not present

## 2017-04-27 DIAGNOSIS — H18413 Arcus senilis, bilateral: Secondary | ICD-10-CM | POA: Diagnosis not present

## 2017-04-27 DIAGNOSIS — H25013 Cortical age-related cataract, bilateral: Secondary | ICD-10-CM | POA: Diagnosis not present

## 2017-04-27 DIAGNOSIS — H2511 Age-related nuclear cataract, right eye: Secondary | ICD-10-CM | POA: Diagnosis not present

## 2017-04-27 DIAGNOSIS — H2513 Age-related nuclear cataract, bilateral: Secondary | ICD-10-CM | POA: Diagnosis not present

## 2017-04-27 DIAGNOSIS — H25043 Posterior subcapsular polar age-related cataract, bilateral: Secondary | ICD-10-CM | POA: Diagnosis not present

## 2017-05-21 DIAGNOSIS — D582 Other hemoglobinopathies: Secondary | ICD-10-CM | POA: Diagnosis not present

## 2017-06-22 DIAGNOSIS — H25812 Combined forms of age-related cataract, left eye: Secondary | ICD-10-CM | POA: Diagnosis not present

## 2017-06-22 DIAGNOSIS — H2512 Age-related nuclear cataract, left eye: Secondary | ICD-10-CM | POA: Diagnosis not present

## 2017-06-23 DIAGNOSIS — H2511 Age-related nuclear cataract, right eye: Secondary | ICD-10-CM | POA: Diagnosis not present

## 2017-07-05 IMAGING — CR DG KNEE COMPLETE 4+V*R*
1 series · 1 of 1 positions shown · non-contrast
Comparison: None.

CLINICAL DATA: Right knee pain for 3 weeks

EXAM:
RIGHT KNEE - COMPLETE 4+ VIEW

[view not recorded]
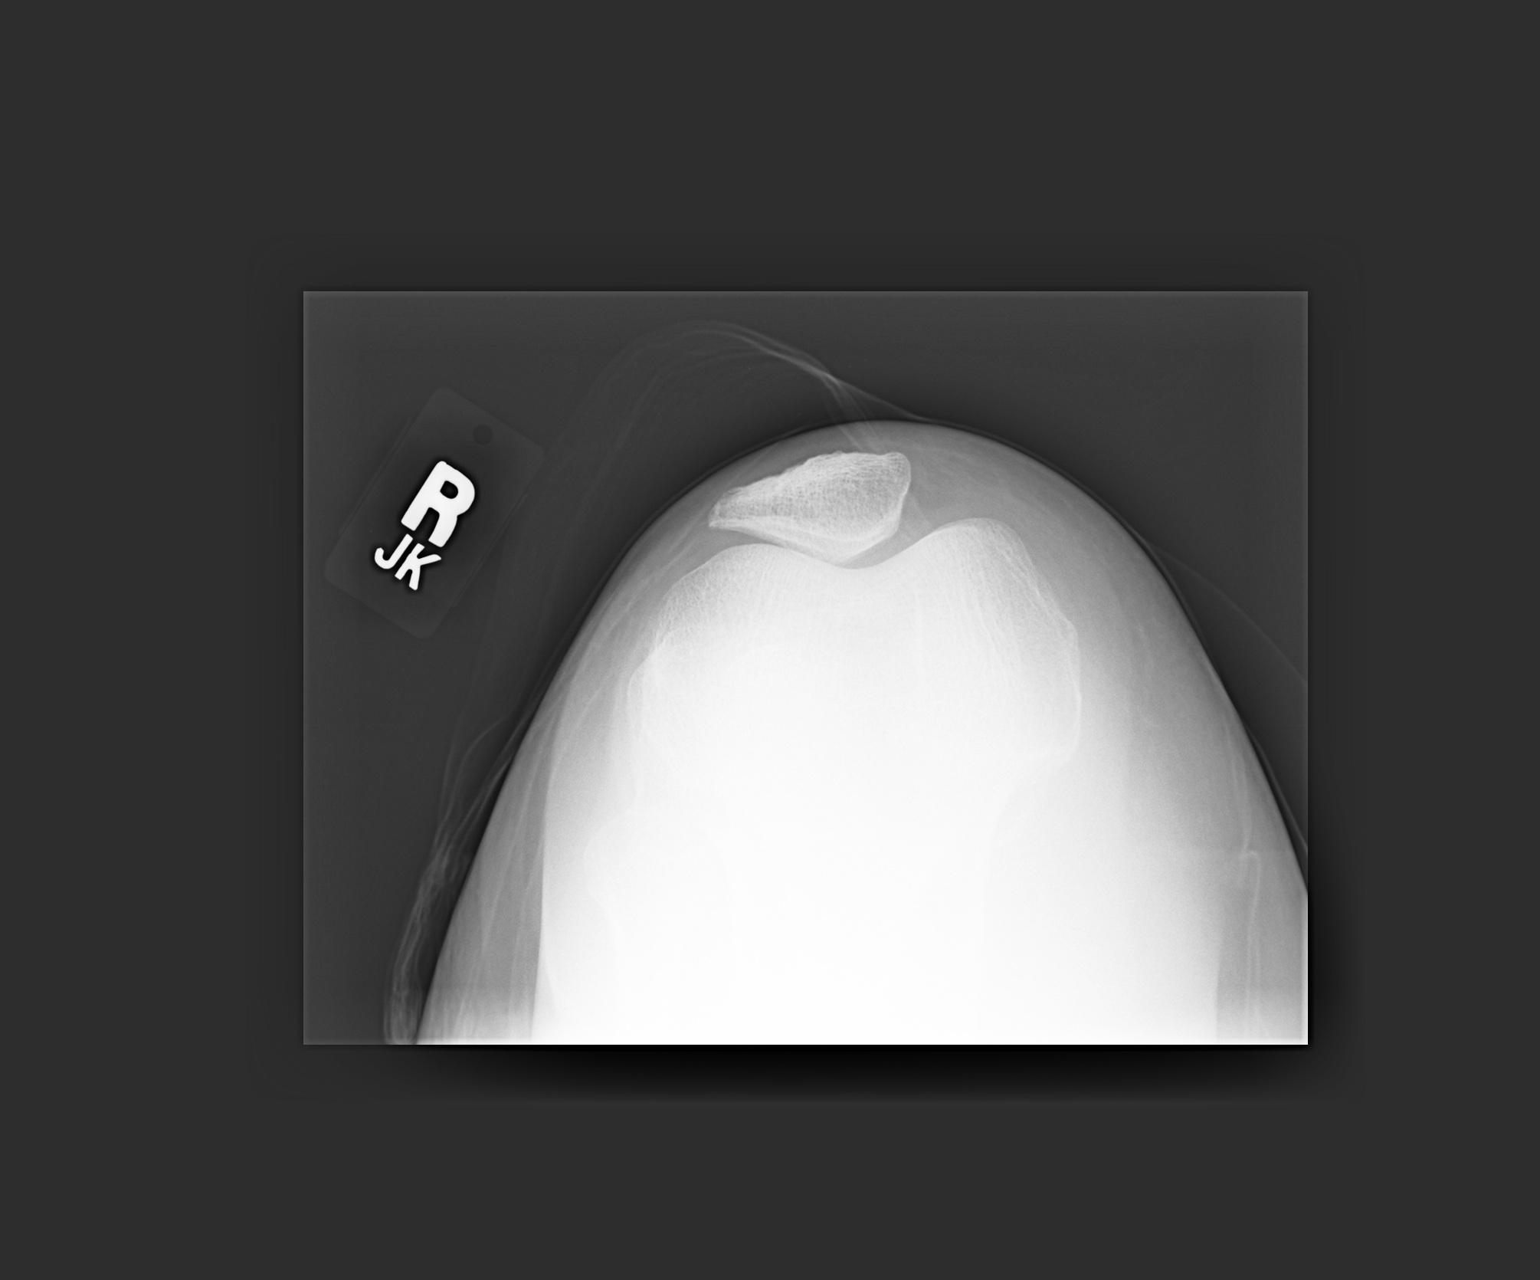

[1 of 1 positions shown; findings below may reference images not displayed]

FINDINGS: No evidence of fracture, dislocation, or joint effusion. Generalized
osteopenia. No evidence of arthropathy or other focal bone
abnormality. Soft tissues are unremarkable.
IMPRESSION: No acute osseous injury of the right knee.

## 2017-07-13 DIAGNOSIS — H2511 Age-related nuclear cataract, right eye: Secondary | ICD-10-CM | POA: Diagnosis not present

## 2017-07-13 DIAGNOSIS — H25811 Combined forms of age-related cataract, right eye: Secondary | ICD-10-CM | POA: Diagnosis not present

## 2017-08-27 ENCOUNTER — Telehealth: Payer: Self-pay | Admitting: *Deleted

## 2017-08-27 NOTE — Telephone Encounter (Signed)
Returned the patient's call and scheduled an appt for April 3rd at 1:15pm

## 2017-09-14 ENCOUNTER — Telehealth: Payer: Self-pay | Admitting: *Deleted

## 2017-09-14 NOTE — Telephone Encounter (Signed)
Returned the patient's rescheduled her appt from 4/3 to 5/22

## 2017-09-15 ENCOUNTER — Ambulatory Visit: Payer: PPO | Admitting: Gynecologic Oncology

## 2017-10-11 DIAGNOSIS — F419 Anxiety disorder, unspecified: Secondary | ICD-10-CM | POA: Diagnosis not present

## 2017-10-11 DIAGNOSIS — Z8544 Personal history of malignant neoplasm of other female genital organs: Secondary | ICD-10-CM | POA: Diagnosis not present

## 2017-10-11 DIAGNOSIS — E2839 Other primary ovarian failure: Secondary | ICD-10-CM | POA: Diagnosis not present

## 2017-10-11 DIAGNOSIS — Z1389 Encounter for screening for other disorder: Secondary | ICD-10-CM | POA: Diagnosis not present

## 2017-10-11 DIAGNOSIS — M545 Low back pain: Secondary | ICD-10-CM | POA: Diagnosis not present

## 2017-10-11 DIAGNOSIS — E559 Vitamin D deficiency, unspecified: Secondary | ICD-10-CM | POA: Diagnosis not present

## 2017-10-11 DIAGNOSIS — I10 Essential (primary) hypertension: Secondary | ICD-10-CM | POA: Diagnosis not present

## 2017-10-11 DIAGNOSIS — Z0000 Encounter for general adult medical examination without abnormal findings: Secondary | ICD-10-CM | POA: Diagnosis not present

## 2017-10-11 DIAGNOSIS — Z6830 Body mass index (BMI) 30.0-30.9, adult: Secondary | ICD-10-CM | POA: Diagnosis not present

## 2017-10-11 DIAGNOSIS — E7800 Pure hypercholesterolemia, unspecified: Secondary | ICD-10-CM | POA: Diagnosis not present

## 2017-10-11 DIAGNOSIS — M8580 Other specified disorders of bone density and structure, unspecified site: Secondary | ICD-10-CM | POA: Diagnosis not present

## 2017-11-03 ENCOUNTER — Encounter: Payer: Self-pay | Admitting: Gynecologic Oncology

## 2017-11-03 ENCOUNTER — Inpatient Hospital Stay: Payer: PPO | Attending: Gynecologic Oncology | Admitting: Gynecologic Oncology

## 2017-11-03 VITALS — BP 152/76 | HR 93 | Temp 98.0°F | Resp 18 | Ht 63.0 in | Wt 167.7 lb

## 2017-11-03 DIAGNOSIS — Z87891 Personal history of nicotine dependence: Secondary | ICD-10-CM | POA: Diagnosis not present

## 2017-11-03 DIAGNOSIS — Z79899 Other long term (current) drug therapy: Secondary | ICD-10-CM | POA: Insufficient documentation

## 2017-11-03 DIAGNOSIS — C519 Malignant neoplasm of vulva, unspecified: Secondary | ICD-10-CM | POA: Diagnosis not present

## 2017-11-03 DIAGNOSIS — F419 Anxiety disorder, unspecified: Secondary | ICD-10-CM | POA: Insufficient documentation

## 2017-11-03 NOTE — Progress Notes (Signed)
Follow-up Note: Gyn-Onc  Second opinion by  Andrea Gomez 77 y.o. female with vulvar carcinoma  CC:  Chief Complaint  Patient presents with  . Vulvar cancer [facility]    Assessment/Plan:  Andrea Gomez  is a 77 y.o.  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  No evidence of disease on today's exam.   Recommend close follow-up with re-evaluation in 6 months for exam and pap.  Discussed high risk for recurrence and notified her to contact me if she develops these symptoms.  HPI: Andrea Gomez is a 77 year old woman who is seen as a second opinion for vulvar cancer. The patient was seen by NP Andrea Gomez in April 2017 for annual surveillance visit. She reported the symptom of vulvar pruritus at this time and therefore was evaluated. Several lesions on the left and anterior vulva were appreciated and were biopsied. A biopsy marked vulvar biopsy red upper was resulted as high-grade vulvar intraepithelial neoplasia, VIN 3, with focal microscopic invasive squamous cell carcinoma. The a.m. 3 was also identified and all other sites of biopsies which included the left inner the left outer, a left black spot, and white upper. The specific anatomic location of these biopsy sites was not available.  The patient was seen in consultation by Dr. Benjamine Gomez skin and [facility] on 10/25/2015 and was recommended to undergo an excisional procedure of the vulva to determine the depth of invasion and the need for lymphadenectomy and possible radical vulvectomy. The patient was concerned about healing and to formally following such a surgery and spent a period of time evaluating online literature. She had done some reading which showed that it was [facility] office minimally invasive surgery approaches such as laparoscopy and was wondering if this was appropriate for her.  The  patient has a remote history of HPV related cervical dysplasia and, approximately 40 years ago, underwent a total hysterectomy for this. She had routine Surveillance in the intervening years and denies being HPV positive during that time.  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 She is a nonsmoker.   On 12/18/15 she went to the OR for a wide local excision. The lesion abutted and involved the left clitoral hood which was excised. The lateral margin appeared grossly wide from the lesion.  Final pathology revealed high grade vulvar intraepithelial neoplasia, suspicious for focal invasion, with <0.23mm invasion. Positive margins for VIN3 (not cancer) at  "8-11 oclock" which represents the medial/clitoral margin.  Interval Hx:  Reports no new lesions or concerning symptoms.  Current Meds:  Outpatient Encounter Medications as of 11/03/2017  Medication Sig  . cyclobenzaprine (FLEXERIL) 10 MG tablet Take 10 mg by mouth at bedtime as needed for muscle spasms. Reported on 12/25/2015  . DULoxetine (CYMBALTA) 60 MG capsule Take 60 mg by mouth 2 (two) times daily.  . nabumetone (RELAFEN) 750 MG tablet Take 750 mg by mouth 2 (two) times daily as needed for mild pain or moderate pain. Reported on 12/25/2015  . triamcinolone cream (KENALOG) 0.1 % apply to affected area twice a day sparingly  prn  . [DISCONTINUED] nystatin (MYCOSTATIN/NYSTOP) powder Apply topically 3 (three) times daily. (Patient not taking: Reported on 11/03/2017)   No facility-administered encounter medications on file as of 11/03/2017.     Allergy: No Known Allergies  Social Hx:   Social  History   Socioeconomic History  . Marital status: Married    Spouse name: Not on file  . Number of children: Not on file  . Years of education: Not on file  . Highest education level: Not on file  Occupational History  . Not on file  Social Needs  . Financial resource strain: Not on file  . Food insecurity:     Worry: Not on file    Inability: Not on file  . Transportation needs:    Medical: Not on file    Non-medical: Not on file  Tobacco Use  . Smoking status: Former Smoker    Packs/day: 2.00    Years: 20.00    Pack years: 40.00    Types: Cigarettes    Last attempt to quit: 12/09/1978    Years since quitting: 38.9  . Smokeless tobacco: Never Used  Substance and Sexual Activity  . Alcohol use: No  . Drug use: No  . Sexual activity: Yes    Birth control/protection: Surgical  Lifestyle  . Physical activity:    Days per week: Not on file    Minutes per session: Not on file  . Stress: Not on file  Relationships  . Social connections:    Talks on phone: Not on file    Gets together: Not on file    Attends religious service: Not on file    Active member of club or organization: Not on file    Attends meetings of clubs or organizations: Not on file    Relationship status: Not on file  . Intimate partner violence:    Fear of current or ex partner: Not on file    Emotionally abused: Not on file    Physically abused: Not on file    Forced sexual activity: Not on file  Other Topics Concern  . Not on file  Social History Narrative  . Not on file    Past Surgical Hx:  Past Surgical History:  Procedure Laterality Date  . COLONOSCOPY  05-03-2008  . TENDON REPAIR LEFT WRIST INURY  age 79  . TONSILLECTOMY  as teen  . VAGINAL HYSTERECTOMY  1970's  . VULVECTOMY Left 12/18/2015   Procedure: WIDE EXCISION OF LEFT ANTERIOR VULVA;  Surgeon: Everitt Amber, MD;  Location: [facility];  Service: Gynecology;  Laterality: Left;    Past Medical Hx:  Past Medical History:  Diagnosis Date  . Anxiety   . History of bronchitis   . 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 History of urinary tract infection   . 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 Wears glasses     Past Gynecological History:  Hysterectomy for CIN  No LMP recorded. Patient has  had a hysterectomy.  Family Hx: History reviewed. No pertinent family history.  Review of Systems:  Constitutional  Feels well,    ENT Normal appearing ears and nares bilaterally Skin/Breast  No rash, sores, jaundice, itching, dryness Cardiovascular  No chest pain, shortness of breath, or edema  Pulmonary  No cough or wheeze.  Gastro Intestinal  No nausea, vomitting, or diarrhoea. No bright red blood per rectum, no abdominal pain, change in bowel movement, or constipation.  Genito Urinary  No frequency, urgency, dysuria,  Musculo Skeletal  No myalgia, arthralgia, joint swelling or pain  Neurologic  No weakness, numbness, change in gait,  Psychology  No depression, anxiety,  insomnia.   Vitals:  Blood pressure (!) 152/76, pulse 93, temperature 98 F (36.7 C), temperature source Oral, resp. rate 18, height 5\' 3"  (1.6 m), weight 167 lb 11.2 oz (76.1 kg), SpO2 99 %.  Physical Exam: WD in NAD Neck  Supple NROM, without any enlargements.  Lymph Node Survey No cervical supraclavicular or inguinal adenopathy Cardiovascular  Pulse normal rate, regularity and rhythm. S1 and S2 normal.  Lungs  Clear to auscultation bilateraly, without wheezes/crackles/rhonchi. Good air movement.  Skin  No rash/lesions/breakdown  Psychiatry  Alert and oriented to person, place, and time  Abdomen  Normoactive bowel sounds, abdomen soft, non-tender and obese without evidence of hernia.  Back No CVA tenderness Genito Urinary  Vulva/vagina: Incision on left lateral anterior vulva and clitoris healed. Acetic acid applied. No acetowhite areas identified. Tinea/candida seen in groin and mons.  Bladder/urethra:  No lesions or masses, well supported bladder  Vagina: atrophic, normal epithelium   Cervix and uterus: surgically absent   Adnexa: no masses. Rectal  deferred  Extremities  No bilateral cyanosis, clubbing or edema.   Thereasa Solo, MD  11/03/2017, 2:50 PM

## 2017-11-03 NOTE — Patient Instructions (Signed)
Please notify Dr Denman George at phone number [phone] if you notice vaginal bleeding, new vulvar lesions or concerning areas.  Please return to see Dr Denman George in 6 months.

## 2017-11-29 DIAGNOSIS — M8588 Other specified disorders of bone density and structure, other site: Secondary | ICD-10-CM | POA: Diagnosis not present

## 2017-11-29 DIAGNOSIS — E2839 Other primary ovarian failure: Secondary | ICD-10-CM | POA: Diagnosis not present

## 2017-12-08 DIAGNOSIS — H26491 Other secondary cataract, right eye: Secondary | ICD-10-CM | POA: Diagnosis not present

## 2018-03-30 DIAGNOSIS — B300 Keratoconjunctivitis due to adenovirus: Secondary | ICD-10-CM | POA: Diagnosis not present

## 2018-03-30 DIAGNOSIS — H43311 Vitreous membranes and strands, right eye: Secondary | ICD-10-CM | POA: Diagnosis not present

## 2018-03-30 DIAGNOSIS — H43391 Other vitreous opacities, right eye: Secondary | ICD-10-CM | POA: Diagnosis not present

## 2018-03-30 DIAGNOSIS — Z961 Presence of intraocular lens: Secondary | ICD-10-CM | POA: Diagnosis not present

## 2018-03-30 DIAGNOSIS — H1859 Other hereditary corneal dystrophies: Secondary | ICD-10-CM | POA: Diagnosis not present

## 2018-04-11 DIAGNOSIS — Z23 Encounter for immunization: Secondary | ICD-10-CM | POA: Diagnosis not present

## 2018-04-22 ENCOUNTER — Telehealth: Payer: Self-pay

## 2018-04-22 NOTE — Telephone Encounter (Signed)
Outgoing call to patient to return her call regarding when is her next appt. No answer, left VM to let her know her next appt is Nov 18 th at 3:15.

## 2018-05-02 ENCOUNTER — Inpatient Hospital Stay: Payer: PPO | Attending: Gynecologic Oncology | Admitting: Gynecologic Oncology

## 2018-05-02 ENCOUNTER — Other Ambulatory Visit (HOSPITAL_COMMUNITY)
Admission: RE | Admit: 2018-05-02 | Discharge: 2018-05-02 | Disposition: A | Discharge status: Home / Self Care | Payer: PPO | Source: Ambulatory Visit | Attending: Gynecologic Oncology | Admitting: Gynecologic Oncology

## 2018-05-02 ENCOUNTER — Encounter: Payer: Self-pay | Admitting: Gynecologic Oncology

## 2018-05-02 VITALS — BP 174/72 | HR 94 | Temp 98.4°F | Resp 18 | Ht 63.0 in | Wt 168.0 lb

## 2018-05-02 DIAGNOSIS — Z87891 Personal history of nicotine dependence: Secondary | ICD-10-CM

## 2018-05-02 DIAGNOSIS — C519 Malignant neoplasm of vulva, unspecified: Secondary | ICD-10-CM | POA: Diagnosis not present

## 2018-05-02 DIAGNOSIS — B3789 Other sites of candidiasis: Secondary | ICD-10-CM | POA: Diagnosis not present

## 2018-05-02 DIAGNOSIS — Z90710 Acquired absence of both cervix and uterus: Secondary | ICD-10-CM | POA: Diagnosis not present

## 2018-05-02 MED ORDER — NYSTATIN 100000 UNIT/GM EX POWD: Freq: Two times a day (BID) | CUTANEOUS | 0 refills | Status: DC

## 2018-05-02 NOTE — Progress Notes (Signed)
Follow-up Note: Gyn-Onc   Andrea Gomez 77 y.o. female with history of stage IA vulvar carcinoma  CC:  Chief Complaint  Patient presents with  . Vulvar Cancer    Assessment/Plan:  Andrea Gomez  is a 77 y.o.  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  No evidence of disease on today's exam.  Will follow-up the results of today's pap.   Recommend close follow-up with re-evaluation in 6 months for exam and pap in 12 months (November, 2020).  Discussed high risk for recurrence and notified her to contact me if she develops these symptoms.  HPI: Andrea Gomez is a 77 year old woman who is seen as a second opinion for vulvar cancer. The patient was seen by NP Auma Thongteum in April 2017 for annual surveillance visit. She reported the symptom of vulvar pruritus at this time and therefore was evaluated. Several lesions on the left and anterior vulva were appreciated and were biopsied. A biopsy marked vulvar biopsy red upper was resulted as high-grade vulvar intraepithelial neoplasia, VIN 3, with focal microscopic invasive squamous cell carcinoma. The a.m. 3 was also identified and all other sites of biopsies which included the left inner the left outer, a left black spot, and white upper. The specific anatomic location of these biopsy sites was not available.  The patient was seen in consultation by Dr. Benjamine Mola skin and [facility] on 10/25/2015 and was recommended to undergo an excisional procedure of the vulva to determine the depth of invasion and the need for lymphadenectomy and possible radical vulvectomy. The patient was concerned about healing and to formally following such a surgery and spent a period of time evaluating online literature. She had done some reading which showed that it was [facility] office minimally invasive surgery approaches such as  laparoscopy and was wondering if this was appropriate for her.  The patient has a remote history of HPV related cervical dysplasia and, approximately 40 years ago, underwent a total hysterectomy for this. She had routine Surveillance in the intervening years and denies being HPV positive during that time.  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 She is a nonsmoker.   On 12/18/15 she went to the OR for a wide local excision. The lesion abutted and involved the left clitoral hood which was excised. The lateral margin appeared grossly wide from the lesion.  Final pathology revealed high grade vulvar intraepithelial neoplasia, suspicious for focal invasion, with <0.56mm invasion. Positive margins for VIN3 (not cancer) at  "8-11 oclock" which represents the medial/clitoral margin.  Interval Hx:  Reports no new lesions or concerning symptoms.  Current Meds:  Outpatient Encounter Medications as of 05/02/2018  Medication Sig  . cyclobenzaprine (FLEXERIL) 10 MG tablet Take 10 mg by mouth at bedtime as needed for muscle spasms. Reported on 12/25/2015  . DULoxetine (CYMBALTA) 60 MG capsule Take 60 mg by mouth 2 (two) times daily.  . nabumetone (RELAFEN) 750 MG tablet Take 750 mg by mouth 2 (two) times daily as needed for mild pain or moderate pain. Reported on 12/25/2015  . triamcinolone cream (KENALOG) 0.1 % apply to affected area twice a day sparingly  prn  . nystatin (MYCOSTATIN/NYSTOP) powder Apply topically 2 (two) times daily.   No facility-administered encounter medications on file as of 05/02/2018.     Allergy: No Known Allergies  Social Hx:   Social History   Socioeconomic History  . Marital status: Married    Spouse name: Not on file  . Number of children: Not on file  . Years of education: Not on file  . Highest education level: Not on file  Occupational History  . Not on file  Social Needs  . Financial resource strain: Not on file  . Food  insecurity:    Worry: Not on file    Inability: Not on file  . Transportation needs:    Medical: Not on file    Non-medical: Not on file  Tobacco Use  . Smoking status: Former Smoker    Packs/day: 2.00    Years: 20.00    Pack years: 40.00    Types: Cigarettes    Last attempt to quit: 12/09/1978    Years since quitting: 39.4  . Smokeless tobacco: Never Used  Substance and Sexual Activity  . Alcohol use: No  . Drug use: No  . Sexual activity: Yes    Birth control/protection: Surgical  Lifestyle  . Physical activity:    Days per week: Not on file    Minutes per session: Not on file  . Stress: Not on file  Relationships  . Social connections:    Talks on phone: Not on file    Gets together: Not on file    Attends religious service: Not on file    Active member of club or organization: Not on file    Attends meetings of clubs or organizations: Not on file    Relationship status: Not on file  . Intimate partner violence:    Fear of current or ex partner: Not on file    Emotionally abused: Not on file    Physically abused: Not on file    Forced sexual activity: Not on file  Other Topics Concern  . Not on file  Social History Narrative  . Not on file    Past Surgical Hx:  Past Surgical History:  Procedure Laterality Date  . COLONOSCOPY  05-03-2008  . TENDON REPAIR LEFT WRIST INURY  age 75  . TONSILLECTOMY  as teen  . VAGINAL HYSTERECTOMY  1970's  . VULVECTOMY Left 12/18/2015   Procedure: WIDE EXCISION OF LEFT ANTERIOR VULVA;  Surgeon: Everitt Amber, MD;  Location: [facility];  Service: Gynecology;  Laterality: Left;    Past Medical Hx:  Past Medical History:  Diagnosis Date  . Anxiety   . History of bronchitis   . 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 History of urinary tract infection   . Vulva cancer ([place])    Stage 1A or 1B  Squamous Cell Carcinoma  . Wears glasses     Past Gynecological History:  Hysterectomy for CIN  No LMP recorded.  Patient has had a hysterectomy.  Family Hx: History reviewed. No pertinent family history.  Review of Systems:  Constitutional  Feels well,    ENT Normal appearing ears and nares bilaterally Skin/Breast  No rash, sores, jaundice, itching, dryness Cardiovascular  No chest pain, shortness of breath, or edema  Pulmonary  No cough or wheeze.  Gastro Intestinal  No nausea, vomitting, or diarrhoea. No bright red blood per rectum, no abdominal pain, change in bowel movement, or constipation.  Genito Urinary  No frequency, urgency, dysuria,  Musculo Skeletal  No myalgia, arthralgia, joint swelling or pain  Neurologic  No weakness, numbness, change in gait,  Psychology  No depression, anxiety, insomnia.   Vitals:  Blood pressure (!) 174/72, pulse 94, temperature 98.4 F (36.9 C), temperature source Oral, resp. rate 18, height 5\' 3"  (1.6 m), weight 168 lb (76.2 kg), SpO2 100 %.  Physical Exam: WD in NAD Neck  Supple NROM, without any enlargements.  Lymph Node Survey No cervical supraclavicular or inguinal adenopathy Cardiovascular  Pulse normal rate, regularity and rhythm. S1 and S2 normal.  Lungs  Clear to auscultation bilateraly, without wheezes/crackles/rhonchi. Good air movement.  Skin  No rash/lesions/breakdown  Psychiatry  Alert and oriented to person, place, and time  Abdomen  Normoactive bowel sounds, abdomen soft, non-tender and obese without evidence of hernia.  Back No CVA tenderness Genito Urinary  Vulva/vagina:Site of incision on left lateral anterior vulva and clitoris healed. Acetic acid applied. No acetowhite areas identified. +Tinea/candida seen in groin and mons.  Bladder/urethra:  No lesions or masses, well supported bladder  Vagina: atrophic, normal epithelium   Cervix and uterus: surgically absent   Adnexa: no masses. Rectal  deferred  Extremities  No bilateral cyanosis, clubbing or edema.   Thereasa Solo, MD  05/02/2018, 3:42 PM

## 2018-05-02 NOTE — Patient Instructions (Addendum)
Please notify Dr Denman George at phone number [phone] if you notice vaginal bleeding, new pelvic or abdominal pains, bloating, feeling full easy, or a change in bladder or bowel function.   Please return to see Dr Denman George in May, 2020.  If you notice any vulvar lesions before that time, please notify her office.  Dr Serita Grit office will contact you with the results of your pap smear. This may take up to 2 weeks.   Please use the nystatin powder in the groin creases morning and night for 2 weeks.

## 2018-05-05 LAB — CYTOLOGY - PAP: HPV (WINDOPATH): NOT DETECTED

## 2018-05-06 ENCOUNTER — Telehealth: Payer: Self-pay | Admitting: *Deleted

## 2018-05-06 NOTE — Telephone Encounter (Signed)
I called patient to inform her of her results from her Pap Smear, unable to reach patient, left a voicemail for patient to call our office back.

## 2018-05-10 ENCOUNTER — Telehealth: Payer: Self-pay | Admitting: *Deleted

## 2018-05-10 ENCOUNTER — Telehealth: Payer: Self-pay

## 2018-05-10 NOTE — Telephone Encounter (Signed)
Outgoing call to pt per Joylene John NP regarding " pap is normal, shows some atrophy, due to thinning from aging -nothing to do unless issues"  No answer, left VM for her to call our office.

## 2018-05-10 NOTE — Telephone Encounter (Signed)
The patient returned the call, our office had been trying to reach the patient to notify her of her results from her pap smear done on May 02, 2018.   Per Joylene John, NP pap smear is normal, shows some atrophy and decreased thinning from aging, nothing to do unless the patient is having issues.  Patient states no issues at this time and verbalizes understanding.

## 2018-06-05 ENCOUNTER — Encounter: Payer: Self-pay | Admitting: Gastroenterology

## 2018-08-22 ENCOUNTER — Other Ambulatory Visit: Payer: Self-pay | Admitting: Family Medicine

## 2018-08-22 DIAGNOSIS — R1310 Dysphagia, unspecified: Secondary | ICD-10-CM

## 2018-09-02 ENCOUNTER — Ambulatory Visit
Admission: RE | Admit: 2018-09-02 | Discharge: 2018-09-02 | Disposition: A | Discharge status: Home / Self Care | Payer: PPO | Source: Ambulatory Visit | Attending: Family Medicine | Admitting: Family Medicine

## 2018-09-02 DIAGNOSIS — R1310 Dysphagia, unspecified: Secondary | ICD-10-CM

## 2018-09-02 DIAGNOSIS — K440 Diaphragmatic hernia with obstruction, without gangrene: Secondary | ICD-10-CM | POA: Diagnosis not present

## 2018-09-07 DIAGNOSIS — K219 Gastro-esophageal reflux disease without esophagitis: Secondary | ICD-10-CM | POA: Diagnosis not present

## 2018-09-07 DIAGNOSIS — E2839 Other primary ovarian failure: Secondary | ICD-10-CM | POA: Diagnosis not present

## 2018-09-07 DIAGNOSIS — F419 Anxiety disorder, unspecified: Secondary | ICD-10-CM | POA: Diagnosis not present

## 2018-09-07 DIAGNOSIS — Z8544 Personal history of malignant neoplasm of other female genital organs: Secondary | ICD-10-CM | POA: Diagnosis not present

## 2018-09-07 DIAGNOSIS — E7800 Pure hypercholesterolemia, unspecified: Secondary | ICD-10-CM | POA: Diagnosis not present

## 2018-09-07 DIAGNOSIS — E559 Vitamin D deficiency, unspecified: Secondary | ICD-10-CM | POA: Diagnosis not present

## 2018-09-07 DIAGNOSIS — M545 Low back pain: Secondary | ICD-10-CM | POA: Diagnosis not present

## 2018-09-07 DIAGNOSIS — I10 Essential (primary) hypertension: Secondary | ICD-10-CM | POA: Diagnosis not present

## 2018-09-07 DIAGNOSIS — M8580 Other specified disorders of bone density and structure, unspecified site: Secondary | ICD-10-CM | POA: Diagnosis not present

## 2018-10-28 DIAGNOSIS — R21 Rash and other nonspecific skin eruption: Secondary | ICD-10-CM | POA: Diagnosis not present

## 2018-10-31 DIAGNOSIS — I10 Essential (primary) hypertension: Secondary | ICD-10-CM | POA: Diagnosis not present

## 2018-10-31 DIAGNOSIS — E559 Vitamin D deficiency, unspecified: Secondary | ICD-10-CM | POA: Diagnosis not present

## 2018-10-31 DIAGNOSIS — E7800 Pure hypercholesterolemia, unspecified: Secondary | ICD-10-CM | POA: Diagnosis not present

## 2018-11-01 ENCOUNTER — Telehealth: Payer: Self-pay | Admitting: *Deleted

## 2018-11-01 NOTE — Telephone Encounter (Signed)
Called and spoke with the patient regarding her appt for tomorrow. Patient has no signs/sympotms, has not traveled and has had no exposure to COVID. Explained the new check in process, new parking process and the mask/no visitor policy.  

## 2018-11-02 ENCOUNTER — Inpatient Hospital Stay: Payer: PPO | Attending: Gynecologic Oncology | Admitting: Gynecologic Oncology

## 2018-11-02 ENCOUNTER — Other Ambulatory Visit: Payer: Self-pay

## 2018-11-02 ENCOUNTER — Encounter: Payer: Self-pay | Admitting: Gynecologic Oncology

## 2018-11-02 VITALS — BP 156/60 | HR 96 | Temp 98.7°F | Resp 20 | Ht 63.0 in | Wt 170.0 lb

## 2018-11-02 DIAGNOSIS — Z90710 Acquired absence of both cervix and uterus: Secondary | ICD-10-CM

## 2018-11-02 DIAGNOSIS — Z08 Encounter for follow-up examination after completed treatment for malignant neoplasm: Secondary | ICD-10-CM

## 2018-11-02 DIAGNOSIS — Z8544 Personal history of malignant neoplasm of other female genital organs: Secondary | ICD-10-CM

## 2018-11-02 DIAGNOSIS — A630 Anogenital (venereal) warts: Secondary | ICD-10-CM | POA: Diagnosis not present

## 2018-11-02 DIAGNOSIS — C519 Malignant neoplasm of vulva, unspecified: Secondary | ICD-10-CM

## 2018-11-02 NOTE — Patient Instructions (Signed)
Please notify Dr Denman George at phone number [phone] if you notice vaginal bleeding, new pelvic or abdominal pains, bloating, feeling full easy, or a change in bladder or bowel function.   Please contact Dr Serita Grit office (at [phone]) in September, 2020 to request an appointment with her for November, 2020 for a pap smear.

## 2018-11-02 NOTE — Progress Notes (Signed)
Follow-up Note: Gyn-Onc   Andrea Gomez 78 y.o. female with history of stage IA vulvar carcinoma  CC:  Chief Complaint  Patient presents with  . Vulvar cancer [facility])    Assessment/Plan:  [name]  is a 78 y.o.  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  No evidence of disease on today's exam.  Will follow-up the results of today's pap.   Recommend close follow-up with re-evaluation in 6 months for pap  (November, 2020).  Discussed high risk for recurrence and notified her to contact me if she develops these symptoms.  HPI: Andrea Gomez is a 78 year old woman who is seen as a second opinion for vulvar cancer. The patient was seen by NP Auma Thongteum in April 2017 for annual surveillance visit. She reported the symptom of vulvar pruritus at this time and therefore was evaluated. Several lesions on the left and anterior vulva were appreciated and were biopsied. A biopsy marked vulvar biopsy red upper was resulted as high-grade vulvar intraepithelial neoplasia, VIN 3, with focal microscopic invasive squamous cell carcinoma. The a.m. 3 was also identified and all other sites of biopsies which included the left inner the left outer, a left black spot, and white upper. The specific anatomic location of these biopsy sites was not available.  The patient was seen in consultation by Dr. Benjamine Mola skin and [facility] on 10/25/2015 and was recommended to undergo an excisional procedure of the vulva to determine the depth of invasion and the need for lymphadenectomy and possible radical vulvectomy. The patient was concerned about healing and to formally following such a surgery and spent a period of time evaluating online literature. She had done some reading which showed that it was [facility] office minimally invasive surgery approaches such as laparoscopy  and was wondering if this was appropriate for her.  The patient has a remote history of HPV related cervical dysplasia and, approximately 40 years ago, underwent a total hysterectomy for this. She had routine Surveillance in the intervening years and denies being HPV positive during that time.  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 She is a nonsmoker.   On 12/18/15 she went to the OR for a wide local excision. The lesion abutted and involved the left clitoral hood which was excised. The lateral margin appeared grossly wide from the lesion.  Final pathology revealed high grade vulvar intraepithelial neoplasia, suspicious for focal invasion, with <0.32mm invasion. Positive margins for VIN3 (not cancer) at  "8-11 oclock" which represents the medial/clitoral margin.  Interval Hx:  Reports no new lesions or concerning symptoms. She does have a new generalized body rash which is being worked up by dermatology.   Current Meds:  Outpatient Encounter Medications as of 11/02/2018  Medication Sig  . cyclobenzaprine (FLEXERIL) 10 MG tablet Take 10 mg by mouth at bedtime as needed for muscle spasms. Reported on 12/25/2015  . DULoxetine (CYMBALTA) 60 MG capsule Take 60 mg by mouth 2 (two) times daily.  . nabumetone (RELAFEN) 750 MG tablet Take 750 mg by mouth 2 (two) times daily as needed for mild pain or moderate pain. Reported on 12/25/2015  . omeprazole (PRILOSEC) 10 MG capsule TK 1 C PO QAM  . triamcinolone cream (KENALOG) 0.1 % apply to affected area twice a day sparingly  prn  . valsartan (DIOVAN) 80  MG tablet   . nystatin (MYCOSTATIN/NYSTOP) powder Apply topically 2 (two) times daily. (Patient not taking: Reported on 11/02/2018)   No facility-administered encounter medications on file as of 11/02/2018.     Allergy: No Known Allergies  Social Hx:   Social History   Socioeconomic History  . Marital status: Married    Spouse name: Not on file  . Number of  children: Not on file  . Years of education: Not on file  . Highest education level: Not on file  Occupational History  . Not on file  Social Needs  . Financial resource strain: Not on file  . Food insecurity:    Worry: Not on file    Inability: Not on file  . Transportation needs:    Medical: Not on file    Non-medical: Not on file  Tobacco Use  . Smoking status: Former Smoker    Packs/day: 2.00    Years: 20.00    Pack years: 40.00    Types: Cigarettes    Last attempt to quit: 12/09/1978    Years since quitting: 39.9  . Smokeless tobacco: Never Used  Substance and Sexual Activity  . Alcohol use: No  . Drug use: No  . Sexual activity: Yes    Birth control/protection: Surgical  Lifestyle  . Physical activity:    Days per week: Not on file    Minutes per session: Not on file  . Stress: Not on file  Relationships  . Social connections:    Talks on phone: Not on file    Gets together: Not on file    Attends religious service: Not on file    Active member of club or organization: Not on file    Attends meetings of clubs or organizations: Not on file    Relationship status: Not on file  . Intimate partner violence:    Fear of current or ex partner: Not on file    Emotionally abused: Not on file    Physically abused: Not on file    Forced sexual activity: Not on file  Other Topics Concern  . Not on file  Social History Narrative  . Not on file    Past Surgical Hx:  Past Surgical History:  Procedure Laterality Date  . COLONOSCOPY  05-03-2008  . TENDON REPAIR LEFT WRIST INURY  age 80  . TONSILLECTOMY  as teen  . VAGINAL HYSTERECTOMY  1970's  . VULVECTOMY Left 12/18/2015   Procedure: WIDE EXCISION OF LEFT ANTERIOR VULVA;  Surgeon: Everitt Amber, MD;  Location: [facility];  Service: Gynecology;  Laterality: Left;    Past Medical Hx:  Past Medical History:  Diagnosis Date  . Anxiety   . History of bronchitis   . History of cervical dysplasia    1970's  dx CIN/ HPV  s/p   TAH  . History of urinary tract infection   . Vulva cancer ([place])    Stage 1A or 1B  Squamous Cell Carcinoma  . Wears glasses     Past Gynecological History:  Hysterectomy for CIN  No LMP recorded. Patient has had a hysterectomy.  Family Hx: History reviewed. No pertinent family history.  Review of Systems:  Constitutional  Feels well,    ENT Normal appearing ears and nares bilaterally Skin/Breast  + generalized rash Cardiovascular  No chest pain, shortness of breath, or edema  Pulmonary  No cough or wheeze.  Gastro Intestinal  No nausea, vomitting, or diarrhoea. No bright red blood per rectum, no  abdominal pain, change in bowel movement, or constipation.  Genito Urinary  No frequency, urgency, dysuria,  Musculo Skeletal  No myalgia, arthralgia, joint swelling or pain  Neurologic  No weakness, numbness, change in gait,  Psychology  No depression, anxiety, insomnia.   Vitals:  Blood pressure (!) 156/60, pulse 96, temperature 98.7 F (37.1 C), temperature source Oral, resp. rate 20, height 5\' 3"  (1.6 m), weight 170 lb (77.1 kg), SpO2 99 %.  Physical Exam: WD in NAD Neck  Supple NROM, without any enlargements.  Lymph Node Survey No cervical supraclavicular or inguinal adenopathy Cardiovascular  Pulse normal rate, regularity and rhythm. S1 and S2 normal.  Lungs  Clear to auscultation bilateraly, without wheezes/crackles/rhonchi. Good air movement.  Skin  + generalized rash Psychiatry  Alert and oriented to person, place, and time  Abdomen  Normoactive bowel sounds, abdomen soft, non-tender and obese without evidence of hernia.  Back No CVA tenderness Genito Urinary  Vulva/vagina:Site of incision on left lateral anterior vulva and clitoris healed. Acetic acid applied. No acetowhite areas identified.  Bladder/urethra:  No lesions or masses, well supported bladder  Vagina: atrophic, normal epithelium   Cervix and uterus: surgically absent   Adnexa: no  masses. Rectal  deferred  Extremities  No bilateral cyanosis, clubbing or edema.   Thereasa Solo, MD  11/02/2018, 2:43 PM

## 2018-11-11 DIAGNOSIS — R21 Rash and other nonspecific skin eruption: Secondary | ICD-10-CM | POA: Diagnosis not present

## 2018-11-14 DIAGNOSIS — L42 Pityriasis rosea: Secondary | ICD-10-CM | POA: Diagnosis not present

## 2018-11-14 DIAGNOSIS — Z79899 Other long term (current) drug therapy: Secondary | ICD-10-CM | POA: Diagnosis not present

## 2018-11-16 DIAGNOSIS — E7800 Pure hypercholesterolemia, unspecified: Secondary | ICD-10-CM | POA: Diagnosis not present

## 2018-11-16 DIAGNOSIS — E559 Vitamin D deficiency, unspecified: Secondary | ICD-10-CM | POA: Diagnosis not present

## 2018-11-16 DIAGNOSIS — E2839 Other primary ovarian failure: Secondary | ICD-10-CM | POA: Diagnosis not present

## 2018-11-16 DIAGNOSIS — M8580 Other specified disorders of bone density and structure, unspecified site: Secondary | ICD-10-CM | POA: Diagnosis not present

## 2018-11-16 DIAGNOSIS — M545 Low back pain: Secondary | ICD-10-CM | POA: Diagnosis not present

## 2018-11-16 DIAGNOSIS — Z1389 Encounter for screening for other disorder: Secondary | ICD-10-CM | POA: Diagnosis not present

## 2018-11-16 DIAGNOSIS — K219 Gastro-esophageal reflux disease without esophagitis: Secondary | ICD-10-CM | POA: Diagnosis not present

## 2018-11-16 DIAGNOSIS — Z6830 Body mass index (BMI) 30.0-30.9, adult: Secondary | ICD-10-CM | POA: Diagnosis not present

## 2018-11-16 DIAGNOSIS — Z0000 Encounter for general adult medical examination without abnormal findings: Secondary | ICD-10-CM | POA: Diagnosis not present

## 2018-11-16 DIAGNOSIS — I10 Essential (primary) hypertension: Secondary | ICD-10-CM | POA: Diagnosis not present

## 2018-11-16 DIAGNOSIS — F419 Anxiety disorder, unspecified: Secondary | ICD-10-CM | POA: Diagnosis not present

## 2018-11-16 DIAGNOSIS — Z8544 Personal history of malignant neoplasm of other female genital organs: Secondary | ICD-10-CM | POA: Diagnosis not present

## 2018-11-22 DIAGNOSIS — Z20828 Contact with and (suspected) exposure to other viral communicable diseases: Secondary | ICD-10-CM | POA: Diagnosis not present

## 2018-11-22 DIAGNOSIS — Z1383 Encounter for screening for respiratory disorder NEC: Secondary | ICD-10-CM | POA: Diagnosis not present

## 2019-04-20 DIAGNOSIS — D582 Other hemoglobinopathies: Secondary | ICD-10-CM | POA: Diagnosis not present

## 2019-04-20 DIAGNOSIS — E7800 Pure hypercholesterolemia, unspecified: Secondary | ICD-10-CM | POA: Diagnosis not present

## 2019-04-20 DIAGNOSIS — I10 Essential (primary) hypertension: Secondary | ICD-10-CM | POA: Diagnosis not present

## 2019-04-25 DIAGNOSIS — Z20828 Contact with and (suspected) exposure to other viral communicable diseases: Secondary | ICD-10-CM | POA: Diagnosis not present

## 2019-05-22 DIAGNOSIS — Z8544 Personal history of malignant neoplasm of other female genital organs: Secondary | ICD-10-CM | POA: Diagnosis not present

## 2019-05-22 DIAGNOSIS — K219 Gastro-esophageal reflux disease without esophagitis: Secondary | ICD-10-CM | POA: Diagnosis not present

## 2019-05-22 DIAGNOSIS — N289 Disorder of kidney and ureter, unspecified: Secondary | ICD-10-CM | POA: Diagnosis not present

## 2019-05-22 DIAGNOSIS — M8580 Other specified disorders of bone density and structure, unspecified site: Secondary | ICD-10-CM | POA: Diagnosis not present

## 2019-05-22 DIAGNOSIS — D582 Other hemoglobinopathies: Secondary | ICD-10-CM | POA: Diagnosis not present

## 2019-05-22 DIAGNOSIS — E7800 Pure hypercholesterolemia, unspecified: Secondary | ICD-10-CM | POA: Diagnosis not present

## 2019-05-22 DIAGNOSIS — E559 Vitamin D deficiency, unspecified: Secondary | ICD-10-CM | POA: Diagnosis not present

## 2019-05-22 DIAGNOSIS — M545 Low back pain: Secondary | ICD-10-CM | POA: Diagnosis not present

## 2019-05-22 DIAGNOSIS — F419 Anxiety disorder, unspecified: Secondary | ICD-10-CM | POA: Diagnosis not present

## 2019-05-22 DIAGNOSIS — I10 Essential (primary) hypertension: Secondary | ICD-10-CM | POA: Diagnosis not present

## 2019-05-26 DIAGNOSIS — I10 Essential (primary) hypertension: Secondary | ICD-10-CM | POA: Diagnosis not present

## 2019-05-26 DIAGNOSIS — Z23 Encounter for immunization: Secondary | ICD-10-CM | POA: Diagnosis not present

## 2019-05-26 DIAGNOSIS — N289 Disorder of kidney and ureter, unspecified: Secondary | ICD-10-CM | POA: Diagnosis not present

## 2019-06-13 DIAGNOSIS — Z1383 Encounter for screening for respiratory disorder NEC: Secondary | ICD-10-CM | POA: Diagnosis not present

## 2019-06-13 DIAGNOSIS — Z20828 Contact with and (suspected) exposure to other viral communicable diseases: Secondary | ICD-10-CM | POA: Diagnosis not present

## 2019-06-20 DIAGNOSIS — Z20828 Contact with and (suspected) exposure to other viral communicable diseases: Secondary | ICD-10-CM | POA: Diagnosis not present

## 2019-06-29 ENCOUNTER — Other Ambulatory Visit (HOSPITAL_COMMUNITY)
Admission: RE | Admit: 2019-06-29 | Discharge: 2019-06-29 | Disposition: A | Discharge status: Home / Self Care | Payer: PPO | Source: Ambulatory Visit | Attending: Gynecologic Oncology | Admitting: Gynecologic Oncology

## 2019-06-29 ENCOUNTER — Other Ambulatory Visit: Payer: Self-pay

## 2019-06-29 ENCOUNTER — Inpatient Hospital Stay: Payer: PPO | Attending: Gynecologic Oncology | Admitting: Gynecologic Oncology

## 2019-06-29 ENCOUNTER — Encounter: Payer: Self-pay | Admitting: Gynecologic Oncology

## 2019-06-29 VITALS — BP 135/76 | HR 100 | Temp 97.5°F | Resp 18 | Ht 63.0 in | Wt 164.3 lb

## 2019-06-29 DIAGNOSIS — Z1151 Encounter for screening for human papillomavirus (HPV): Secondary | ICD-10-CM | POA: Diagnosis not present

## 2019-06-29 DIAGNOSIS — Z87891 Personal history of nicotine dependence: Secondary | ICD-10-CM | POA: Insufficient documentation

## 2019-06-29 DIAGNOSIS — F419 Anxiety disorder, unspecified: Secondary | ICD-10-CM | POA: Diagnosis not present

## 2019-06-29 DIAGNOSIS — N952 Postmenopausal atrophic vaginitis: Secondary | ICD-10-CM | POA: Diagnosis not present

## 2019-06-29 DIAGNOSIS — Z8544 Personal history of malignant neoplasm of other female genital organs: Secondary | ICD-10-CM | POA: Diagnosis not present

## 2019-06-29 DIAGNOSIS — Z01419 Encounter for gynecological examination (general) (routine) without abnormal findings: Secondary | ICD-10-CM | POA: Diagnosis not present

## 2019-06-29 DIAGNOSIS — Z79899 Other long term (current) drug therapy: Secondary | ICD-10-CM | POA: Diagnosis not present

## 2019-06-29 DIAGNOSIS — A630 Anogenital (venereal) warts: Secondary | ICD-10-CM | POA: Diagnosis not present

## 2019-06-29 DIAGNOSIS — Z90710 Acquired absence of both cervix and uterus: Secondary | ICD-10-CM | POA: Diagnosis not present

## 2019-06-29 DIAGNOSIS — C519 Malignant neoplasm of vulva, unspecified: Secondary | ICD-10-CM | POA: Diagnosis not present

## 2019-06-29 NOTE — Patient Instructions (Signed)
Please notify Dr Jalilah Wiltsie at phone number [phone] if you notice vaginal bleeding, new pelvic or abdominal pains, bloating, feeling full easy, or a change in bladder or bowel function.   Please contact Dr Calypso Hagarty's office (at [phone]) in April, 2021 to request an appointment with her for July, 2021.  

## 2019-06-29 NOTE — Progress Notes (Signed)
Follow-up Note: Gyn-Onc   [name] 79 y.o. female with history of stage IA vulvar carcinoma  CC:  Chief Complaint  Patient presents with  . Vulvar cancer [facility])    Assessment/Plan:  Ms. GENEVEIVE SATKOWIAK  is a 79 y.o.  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  No evidence of disease on today's exam.  Will follow-up the results of today's pap.   Recommend close follow-up with re-evaluation in 6 months for pap  (January, 2022).  Discussed high risk for recurrence and notified her to contact me if she develops these symptoms.  HPI: Korene Elijah is a 79 year old woman who is seen as a second opinion for vulvar cancer. The patient was seen by NP Auma Thongteum in April 2017 for annual surveillance visit. She reported the symptom of vulvar pruritus at this time and therefore was evaluated. Several lesions on the left and anterior vulva were appreciated and were biopsied. A biopsy marked vulvar biopsy red upper was resulted as high-grade vulvar intraepithelial neoplasia, VIN 3, with focal microscopic invasive squamous cell carcinoma. The a.m. 3 was also identified and all other sites of biopsies which included the left inner the left outer, a left black spot, and white upper. The specific anatomic location of these biopsy sites was not available.  The patient was seen in consultation by Dr. Benjamine Mola skin and [facility] on 10/25/2015 and was recommended to undergo an excisional procedure of the vulva to determine the depth of invasion and the need for lymphadenectomy and possible radical vulvectomy. The patient was concerned about healing and to formally following such a surgery and spent a period of time evaluating online literature. She had done some reading which showed that it was [facility] office minimally invasive surgery approaches such as laparoscopy and  was wondering if this was appropriate for her.  The patient has a remote history of HPV related cervical dysplasia and, approximately 40 years ago, underwent a total hysterectomy for this. She had routine Surveillance in the intervening years and denies being HPV positive during that time.  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 She is a nonsmoker.   On 12/18/15 she went to the OR for a wide local excision. The lesion abutted and involved the left clitoral hood which was excised. The lateral margin appeared grossly wide from the lesion.  Final pathology revealed high grade vulvar intraepithelial neoplasia, suspicious for focal invasion, with <0.9mm invasion. Positive margins for VIN3 (not cancer) at  "8-11 oclock" which represents the medial/clitoral margin.  Interval Hx: Reports no new lesions or concerning symptoms.   Current Meds:  Outpatient Encounter Medications as of 06/29/2019  Medication Sig  . DULoxetine (CYMBALTA) 60 MG capsule Take 60 mg by mouth 2 (two) times daily.  Marland Kitchen omeprazole (PRILOSEC) 10 MG capsule TK 1 C PO QAM  . triamcinolone cream (KENALOG) 0.1 % apply to affected area twice a day sparingly  prn  . valsartan (DIOVAN) 80 MG tablet 80 mg daily.   . [DISCONTINUED] cyclobenzaprine (FLEXERIL) 10 MG tablet Take 10 mg by mouth at bedtime as needed for muscle spasms. Reported on 12/25/2015  . [DISCONTINUED] nabumetone (RELAFEN) 750 MG tablet Take 750 mg by mouth 2 (two) times daily as needed for mild pain or moderate pain. Reported on 12/25/2015  . [DISCONTINUED] nystatin (MYCOSTATIN/NYSTOP) powder Apply topically  2 (two) times daily. (Patient not taking: Reported on 11/02/2018)   No facility-administered encounter medications on file as of 06/29/2019.    Allergy: No Known Allergies  Social Hx:   Social History   Socioeconomic History  . Marital status: Married    Spouse name: Not on file  . Number of children: Not on file  . Years  of education: Not on file  . Highest education level: Not on file  Occupational History  . Not on file  Tobacco Use  . Smoking status: Former Smoker    Packs/day: 2.00    Years: 20.00    Pack years: 40.00    Types: Cigarettes    Quit date: 12/09/1978    Years since quitting: 40.5  . Smokeless tobacco: Never Used  Substance and Sexual Activity  . Alcohol use: No  . Drug use: No  . Sexual activity: Yes    Birth control/protection: Surgical  Other Topics Concern  . Not on file  Social History Narrative  . Not on file   Social Determinants of Health   Financial Resource Strain:   . Difficulty of Paying Living Expenses: Not on file  Food Insecurity:   . Worried About Charity fundraiser in the Last Year: Not on file  . Ran Out of Food in the Last Year: Not on file  Transportation Needs:   . Lack of Transportation (Medical): Not on file  . Lack of Transportation (Non-Medical): Not on file  Physical Activity:   . Days of Exercise per Week: Not on file  . Minutes of Exercise per Session: Not on file  Stress:   . Feeling of Stress : Not on file  Social Connections:   . Frequency of Communication with Friends and Family: Not on file  . Frequency of Social Gatherings with Friends and Family: Not on file  . Attends Religious Services: Not on file  . Active Member of Clubs or Organizations: Not on file  . Attends Archivist Meetings: Not on file  . Marital Status: Not on file  Intimate Partner Violence:   . Fear of Current or Ex-Partner: Not on file  . Emotionally Abused: Not on file  . Physically Abused: Not on file  . Sexually Abused: Not on file    Past Surgical Hx:  Past Surgical History:  Procedure Laterality Date  . COLONOSCOPY  05-03-2008  . TENDON REPAIR LEFT WRIST INURY  age 58  . TONSILLECTOMY  as teen  . VAGINAL HYSTERECTOMY  1970's  . VULVECTOMY Left 12/18/2015   Procedure: WIDE EXCISION OF LEFT ANTERIOR VULVA;  Surgeon: Everitt Amber, MD;  Location: [facility]  ORS;  Service: Gynecology;  Laterality: Left;    Past Medical Hx:  Past Medical History:  Diagnosis Date  . Anxiety   . History of bronchitis   . 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 History of urinary tract infection   . Vulva cancer ([place])    Stage 1A or 1B  Squamous Cell Carcinoma  . Wears glasses     Past Gynecological History:  Hysterectomy for CIN  No LMP recorded. Patient has had a hysterectomy.  Family Hx: History reviewed. No pertinent family history.  Review of Systems:  Constitutional  Feels well,    ENT Normal appearing ears and nares bilaterally Skin/Breast  + generalized rash Cardiovascular  No chest pain, shortness of breath, or edema  Pulmonary  No cough or  wheeze.  Gastro Intestinal  No nausea, vomitting, or diarrhoea. No bright red blood per rectum, no abdominal pain, change in bowel movement, or constipation.  Genito Urinary  No frequency, urgency, dysuria,  Musculo Skeletal  No myalgia, arthralgia, joint swelling or pain  Neurologic  No weakness, numbness, change in gait,  Psychology  No depression, anxiety, insomnia.   Vitals:  There were no vitals taken for this visit.  Physical Exam: WD in NAD Neck  Supple NROM, without any enlargements.  Lymph Node Survey No cervical supraclavicular or inguinal adenopathy Cardiovascular  Pulse normal rate, regularity and rhythm. S1 and S2 normal.  Lungs  Clear to auscultation bilateraly, without wheezes/crackles/rhonchi. Good air movement.  Skin  + generalized rash Psychiatry  Alert and oriented to person, place, and time  Abdomen  Normoactive bowel sounds, abdomen soft, non-tender and obese without evidence of hernia.  Back No CVA tenderness Genito Urinary  Vulva/vagina:Site of incision on left lateral anterior vulva and clitoris healed. Acetic acid applied. No acetowhite areas identified.   Bladder/urethra:  No lesions or masses, well supported bladder  Vagina:  atrophic, normal epithelium  Cervix and uterus: surgically absent, pap taken  Adnexa: no masses. Rectal  deferred  Extremities  No bilateral cyanosis, clubbing or edema.   Thereasa Solo, MD  06/29/2019, 2:20 PM

## 2019-07-05 LAB — CYTOLOGY - PAP
Comment: NEGATIVE
Diagnosis: NEGATIVE
High risk HPV: NEGATIVE

## 2019-07-07 ENCOUNTER — Telehealth: Payer: Self-pay

## 2019-07-07 NOTE — Telephone Encounter (Signed)
Told Andrea Gomez that the pap Smear was normal. No HPV detected. The pap is showing atrophic changes with is expected due to her age and decrease in estrogen. Andrea Gomez is not experiencing any excessive vaginal discharge or any symptoms,therefore, not intervention is needed per Joylene John, NP. Pt verbalized understanding.

## 2019-08-22 DIAGNOSIS — Z20828 Contact with and (suspected) exposure to other viral communicable diseases: Secondary | ICD-10-CM | POA: Diagnosis not present

## 2019-09-05 DIAGNOSIS — E7800 Pure hypercholesterolemia, unspecified: Secondary | ICD-10-CM | POA: Diagnosis not present

## 2019-09-05 DIAGNOSIS — I10 Essential (primary) hypertension: Secondary | ICD-10-CM | POA: Diagnosis not present

## 2019-09-05 DIAGNOSIS — Z20828 Contact with and (suspected) exposure to other viral communicable diseases: Secondary | ICD-10-CM | POA: Diagnosis not present

## 2019-09-05 DIAGNOSIS — D582 Other hemoglobinopathies: Secondary | ICD-10-CM | POA: Diagnosis not present

## 2019-09-05 DIAGNOSIS — Z1383 Encounter for screening for respiratory disorder NEC: Secondary | ICD-10-CM | POA: Diagnosis not present

## 2019-09-11 DIAGNOSIS — H35033 Hypertensive retinopathy, bilateral: Secondary | ICD-10-CM | POA: Diagnosis not present

## 2019-09-11 DIAGNOSIS — H353132 Nonexudative age-related macular degeneration, bilateral, intermediate dry stage: Secondary | ICD-10-CM | POA: Diagnosis not present

## 2019-09-19 DIAGNOSIS — Z1383 Encounter for screening for respiratory disorder NEC: Secondary | ICD-10-CM | POA: Diagnosis not present

## 2019-09-19 DIAGNOSIS — Z20828 Contact with and (suspected) exposure to other viral communicable diseases: Secondary | ICD-10-CM | POA: Diagnosis not present

## 2019-11-20 DIAGNOSIS — M8580 Other specified disorders of bone density and structure, unspecified site: Secondary | ICD-10-CM | POA: Diagnosis not present

## 2019-11-20 DIAGNOSIS — E2839 Other primary ovarian failure: Secondary | ICD-10-CM | POA: Diagnosis not present

## 2019-11-20 DIAGNOSIS — M545 Low back pain: Secondary | ICD-10-CM | POA: Diagnosis not present

## 2019-11-20 DIAGNOSIS — E7800 Pure hypercholesterolemia, unspecified: Secondary | ICD-10-CM | POA: Diagnosis not present

## 2019-11-20 DIAGNOSIS — F419 Anxiety disorder, unspecified: Secondary | ICD-10-CM | POA: Diagnosis not present

## 2019-11-20 DIAGNOSIS — Z1389 Encounter for screening for other disorder: Secondary | ICD-10-CM | POA: Diagnosis not present

## 2019-11-20 DIAGNOSIS — K219 Gastro-esophageal reflux disease without esophagitis: Secondary | ICD-10-CM | POA: Diagnosis not present

## 2019-11-20 DIAGNOSIS — N1831 Chronic kidney disease, stage 3a: Secondary | ICD-10-CM | POA: Diagnosis not present

## 2019-11-20 DIAGNOSIS — I10 Essential (primary) hypertension: Secondary | ICD-10-CM | POA: Diagnosis not present

## 2019-11-20 DIAGNOSIS — Z0000 Encounter for general adult medical examination without abnormal findings: Secondary | ICD-10-CM | POA: Diagnosis not present

## 2019-11-20 DIAGNOSIS — E559 Vitamin D deficiency, unspecified: Secondary | ICD-10-CM | POA: Diagnosis not present

## 2019-12-22 DIAGNOSIS — E559 Vitamin D deficiency, unspecified: Secondary | ICD-10-CM | POA: Diagnosis not present

## 2019-12-22 DIAGNOSIS — I10 Essential (primary) hypertension: Secondary | ICD-10-CM | POA: Diagnosis not present

## 2019-12-22 DIAGNOSIS — E7800 Pure hypercholesterolemia, unspecified: Secondary | ICD-10-CM | POA: Diagnosis not present

## 2020-01-02 DIAGNOSIS — D582 Other hemoglobinopathies: Secondary | ICD-10-CM | POA: Diagnosis not present

## 2020-01-02 DIAGNOSIS — I10 Essential (primary) hypertension: Secondary | ICD-10-CM | POA: Diagnosis not present

## 2020-01-02 DIAGNOSIS — N1831 Chronic kidney disease, stage 3a: Secondary | ICD-10-CM | POA: Diagnosis not present

## 2020-01-02 DIAGNOSIS — E7800 Pure hypercholesterolemia, unspecified: Secondary | ICD-10-CM | POA: Diagnosis not present

## 2020-03-11 DIAGNOSIS — H35033 Hypertensive retinopathy, bilateral: Secondary | ICD-10-CM | POA: Diagnosis not present

## 2020-03-11 DIAGNOSIS — H353132 Nonexudative age-related macular degeneration, bilateral, intermediate dry stage: Secondary | ICD-10-CM | POA: Diagnosis not present

## 2020-04-11 DIAGNOSIS — R21 Rash and other nonspecific skin eruption: Secondary | ICD-10-CM | POA: Diagnosis not present

## 2020-05-20 DIAGNOSIS — Z8544 Personal history of malignant neoplasm of other female genital organs: Secondary | ICD-10-CM | POA: Diagnosis not present

## 2020-05-20 DIAGNOSIS — E2839 Other primary ovarian failure: Secondary | ICD-10-CM | POA: Diagnosis not present

## 2020-05-20 DIAGNOSIS — E7800 Pure hypercholesterolemia, unspecified: Secondary | ICD-10-CM | POA: Diagnosis not present

## 2020-05-20 DIAGNOSIS — F419 Anxiety disorder, unspecified: Secondary | ICD-10-CM | POA: Diagnosis not present

## 2020-05-20 DIAGNOSIS — N1831 Chronic kidney disease, stage 3a: Secondary | ICD-10-CM | POA: Diagnosis not present

## 2020-05-20 DIAGNOSIS — M8580 Other specified disorders of bone density and structure, unspecified site: Secondary | ICD-10-CM | POA: Diagnosis not present

## 2020-05-20 DIAGNOSIS — M545 Low back pain, unspecified: Secondary | ICD-10-CM | POA: Diagnosis not present

## 2020-05-20 DIAGNOSIS — R7309 Other abnormal glucose: Secondary | ICD-10-CM | POA: Diagnosis not present

## 2020-05-20 DIAGNOSIS — E559 Vitamin D deficiency, unspecified: Secondary | ICD-10-CM | POA: Diagnosis not present

## 2020-05-20 DIAGNOSIS — I10 Essential (primary) hypertension: Secondary | ICD-10-CM | POA: Diagnosis not present

## 2020-05-20 DIAGNOSIS — Z23 Encounter for immunization: Secondary | ICD-10-CM | POA: Diagnosis not present

## 2020-05-20 DIAGNOSIS — K219 Gastro-esophageal reflux disease without esophagitis: Secondary | ICD-10-CM | POA: Diagnosis not present

## 2020-08-16 DIAGNOSIS — H35033 Hypertensive retinopathy, bilateral: Secondary | ICD-10-CM | POA: Diagnosis not present

## 2020-08-16 DIAGNOSIS — H353132 Nonexudative age-related macular degeneration, bilateral, intermediate dry stage: Secondary | ICD-10-CM | POA: Diagnosis not present

## 2020-11-18 DIAGNOSIS — R7309 Other abnormal glucose: Secondary | ICD-10-CM | POA: Diagnosis not present

## 2020-11-18 DIAGNOSIS — I10 Essential (primary) hypertension: Secondary | ICD-10-CM | POA: Diagnosis not present

## 2020-11-18 DIAGNOSIS — R7301 Impaired fasting glucose: Secondary | ICD-10-CM | POA: Diagnosis not present

## 2020-11-18 DIAGNOSIS — E559 Vitamin D deficiency, unspecified: Secondary | ICD-10-CM | POA: Diagnosis not present

## 2020-11-18 DIAGNOSIS — E7800 Pure hypercholesterolemia, unspecified: Secondary | ICD-10-CM | POA: Diagnosis not present

## 2020-11-25 DIAGNOSIS — K219 Gastro-esophageal reflux disease without esophagitis: Secondary | ICD-10-CM | POA: Diagnosis not present

## 2020-11-25 DIAGNOSIS — R7309 Other abnormal glucose: Secondary | ICD-10-CM | POA: Diagnosis not present

## 2020-11-25 DIAGNOSIS — N1831 Chronic kidney disease, stage 3a: Secondary | ICD-10-CM | POA: Diagnosis not present

## 2020-11-25 DIAGNOSIS — I10 Essential (primary) hypertension: Secondary | ICD-10-CM | POA: Diagnosis not present

## 2020-11-25 DIAGNOSIS — M8580 Other specified disorders of bone density and structure, unspecified site: Secondary | ICD-10-CM | POA: Diagnosis not present

## 2020-11-25 DIAGNOSIS — F419 Anxiety disorder, unspecified: Secondary | ICD-10-CM | POA: Diagnosis not present

## 2020-11-25 DIAGNOSIS — E559 Vitamin D deficiency, unspecified: Secondary | ICD-10-CM | POA: Diagnosis not present

## 2020-11-25 DIAGNOSIS — M545 Low back pain, unspecified: Secondary | ICD-10-CM | POA: Diagnosis not present

## 2020-11-25 DIAGNOSIS — Z0000 Encounter for general adult medical examination without abnormal findings: Secondary | ICD-10-CM | POA: Diagnosis not present

## 2020-11-25 DIAGNOSIS — Z1389 Encounter for screening for other disorder: Secondary | ICD-10-CM | POA: Diagnosis not present

## 2020-11-25 DIAGNOSIS — E7800 Pure hypercholesterolemia, unspecified: Secondary | ICD-10-CM | POA: Diagnosis not present

## 2020-11-25 DIAGNOSIS — E2839 Other primary ovarian failure: Secondary | ICD-10-CM | POA: Diagnosis not present

## 2020-11-28 ENCOUNTER — Other Ambulatory Visit: Payer: Self-pay | Admitting: Family Medicine

## 2020-11-28 DIAGNOSIS — E2839 Other primary ovarian failure: Secondary | ICD-10-CM

## 2021-03-10 DIAGNOSIS — L03031 Cellulitis of right toe: Secondary | ICD-10-CM | POA: Diagnosis not present

## 2021-03-10 DIAGNOSIS — H43813 Vitreous degeneration, bilateral: Secondary | ICD-10-CM | POA: Diagnosis not present

## 2021-03-10 DIAGNOSIS — H353132 Nonexudative age-related macular degeneration, bilateral, intermediate dry stage: Secondary | ICD-10-CM | POA: Diagnosis not present

## 2021-03-10 DIAGNOSIS — H35033 Hypertensive retinopathy, bilateral: Secondary | ICD-10-CM | POA: Diagnosis not present

## 2021-03-21 DIAGNOSIS — L600 Ingrowing nail: Secondary | ICD-10-CM | POA: Diagnosis not present

## 2021-04-12 DIAGNOSIS — L600 Ingrowing nail: Secondary | ICD-10-CM | POA: Diagnosis not present

## 2021-05-28 ENCOUNTER — Ambulatory Visit
Admission: RE | Admit: 2021-05-28 | Discharge: 2021-05-28 | Disposition: A | Discharge status: Home / Self Care | Payer: PPO | Source: Ambulatory Visit | Attending: Family Medicine | Admitting: Family Medicine

## 2021-05-28 DIAGNOSIS — E2839 Other primary ovarian failure: Secondary | ICD-10-CM

## 2021-05-28 DIAGNOSIS — Z780 Asymptomatic menopausal state: Secondary | ICD-10-CM | POA: Diagnosis not present

## 2021-05-28 DIAGNOSIS — M8589 Other specified disorders of bone density and structure, multiple sites: Secondary | ICD-10-CM | POA: Diagnosis not present

## 2021-05-29 DIAGNOSIS — Z8544 Personal history of malignant neoplasm of other female genital organs: Secondary | ICD-10-CM | POA: Diagnosis not present

## 2021-05-29 DIAGNOSIS — F419 Anxiety disorder, unspecified: Secondary | ICD-10-CM | POA: Diagnosis not present

## 2021-05-29 DIAGNOSIS — M545 Low back pain, unspecified: Secondary | ICD-10-CM | POA: Diagnosis not present

## 2021-05-29 DIAGNOSIS — N1831 Chronic kidney disease, stage 3a: Secondary | ICD-10-CM | POA: Diagnosis not present

## 2021-05-29 DIAGNOSIS — E7800 Pure hypercholesterolemia, unspecified: Secondary | ICD-10-CM | POA: Diagnosis not present

## 2021-05-29 DIAGNOSIS — M8580 Other specified disorders of bone density and structure, unspecified site: Secondary | ICD-10-CM | POA: Diagnosis not present

## 2021-05-29 DIAGNOSIS — I10 Essential (primary) hypertension: Secondary | ICD-10-CM | POA: Diagnosis not present

## 2021-05-29 DIAGNOSIS — K219 Gastro-esophageal reflux disease without esophagitis: Secondary | ICD-10-CM | POA: Diagnosis not present

## 2021-05-29 DIAGNOSIS — E559 Vitamin D deficiency, unspecified: Secondary | ICD-10-CM | POA: Diagnosis not present

## 2021-05-29 DIAGNOSIS — E2839 Other primary ovarian failure: Secondary | ICD-10-CM | POA: Diagnosis not present

## 2021-07-16 DIAGNOSIS — F419 Anxiety disorder, unspecified: Secondary | ICD-10-CM | POA: Diagnosis not present

## 2021-07-16 DIAGNOSIS — I10 Essential (primary) hypertension: Secondary | ICD-10-CM | POA: Diagnosis not present

## 2021-07-16 DIAGNOSIS — Z6832 Body mass index (BMI) 32.0-32.9, adult: Secondary | ICD-10-CM | POA: Diagnosis not present

## 2021-12-17 DIAGNOSIS — E559 Vitamin D deficiency, unspecified: Secondary | ICD-10-CM | POA: Diagnosis not present

## 2021-12-17 DIAGNOSIS — E7800 Pure hypercholesterolemia, unspecified: Secondary | ICD-10-CM | POA: Diagnosis not present

## 2021-12-17 DIAGNOSIS — I10 Essential (primary) hypertension: Secondary | ICD-10-CM | POA: Diagnosis not present

## 2021-12-23 DIAGNOSIS — I10 Essential (primary) hypertension: Secondary | ICD-10-CM | POA: Diagnosis not present

## 2021-12-23 DIAGNOSIS — E7800 Pure hypercholesterolemia, unspecified: Secondary | ICD-10-CM | POA: Diagnosis not present

## 2021-12-23 DIAGNOSIS — K219 Gastro-esophageal reflux disease without esophagitis: Secondary | ICD-10-CM | POA: Diagnosis not present

## 2021-12-23 DIAGNOSIS — E559 Vitamin D deficiency, unspecified: Secondary | ICD-10-CM | POA: Diagnosis not present

## 2021-12-23 DIAGNOSIS — N1831 Chronic kidney disease, stage 3a: Secondary | ICD-10-CM | POA: Diagnosis not present

## 2021-12-23 DIAGNOSIS — E2839 Other primary ovarian failure: Secondary | ICD-10-CM | POA: Diagnosis not present

## 2021-12-23 DIAGNOSIS — M8580 Other specified disorders of bone density and structure, unspecified site: Secondary | ICD-10-CM | POA: Diagnosis not present

## 2021-12-23 DIAGNOSIS — N941 Unspecified dyspareunia: Secondary | ICD-10-CM | POA: Diagnosis not present

## 2021-12-23 DIAGNOSIS — F419 Anxiety disorder, unspecified: Secondary | ICD-10-CM | POA: Diagnosis not present

## 2021-12-23 DIAGNOSIS — Z1331 Encounter for screening for depression: Secondary | ICD-10-CM | POA: Diagnosis not present

## 2021-12-23 DIAGNOSIS — L309 Dermatitis, unspecified: Secondary | ICD-10-CM | POA: Diagnosis not present

## 2021-12-23 DIAGNOSIS — Z0000 Encounter for general adult medical examination without abnormal findings: Secondary | ICD-10-CM | POA: Diagnosis not present

## 2022-01-16 DIAGNOSIS — N1831 Chronic kidney disease, stage 3a: Secondary | ICD-10-CM | POA: Diagnosis not present

## 2022-02-26 DIAGNOSIS — L309 Dermatitis, unspecified: Secondary | ICD-10-CM | POA: Diagnosis not present

## 2022-02-26 DIAGNOSIS — L299 Pruritus, unspecified: Secondary | ICD-10-CM | POA: Diagnosis not present

## 2022-02-26 DIAGNOSIS — I10 Essential (primary) hypertension: Secondary | ICD-10-CM | POA: Diagnosis not present

## 2022-03-18 DIAGNOSIS — I10 Essential (primary) hypertension: Secondary | ICD-10-CM | POA: Diagnosis not present

## 2022-03-18 DIAGNOSIS — R21 Rash and other nonspecific skin eruption: Secondary | ICD-10-CM | POA: Diagnosis not present

## 2022-03-25 ENCOUNTER — Inpatient Hospital Stay: Payer: PPO | Admitting: Obstetrics & Gynecology

## 2022-03-31 DIAGNOSIS — H43813 Vitreous degeneration, bilateral: Secondary | ICD-10-CM | POA: Diagnosis not present

## 2022-03-31 DIAGNOSIS — H35033 Hypertensive retinopathy, bilateral: Secondary | ICD-10-CM | POA: Diagnosis not present

## 2022-03-31 DIAGNOSIS — H353113 Nonexudative age-related macular degeneration, right eye, advanced atrophic without subfoveal involvement: Secondary | ICD-10-CM | POA: Diagnosis not present

## 2022-03-31 DIAGNOSIS — H353122 Nonexudative age-related macular degeneration, left eye, intermediate dry stage: Secondary | ICD-10-CM | POA: Diagnosis not present

## 2022-04-02 DIAGNOSIS — L309 Dermatitis, unspecified: Secondary | ICD-10-CM | POA: Diagnosis not present

## 2022-04-02 DIAGNOSIS — L308 Other specified dermatitis: Secondary | ICD-10-CM | POA: Diagnosis not present

## 2022-04-21 ENCOUNTER — Encounter: Payer: Self-pay | Admitting: Obstetrics & Gynecology

## 2022-04-21 NOTE — Progress Notes (Unsigned)
Follow Up Note: Gyn-Onc  Andrea Gomez 81 y.o. female  CC: She presents for a follow-up visit   HPI: The oncology history was reviewed.  Interval History: She denies any new lesions, itching, leg/groin pain, urinary symptoms, leg swelling, weight loss or cough. A Pap in 1/21 showed NILM.     She has had a lapse follow-up.  Several mos ago she had a generalized body rash that was felt to be a reaction to valsartan.  She has also been diagnosed w/tinea, eczema.    Review of Systems  Review of Systems  Constitutional:  Negative for malaise/fatigue and weight loss.  Respiratory:  Negative for shortness of breath and wheezing.   Cardiovascular:  Negative for chest pain and leg swelling.  Gastrointestinal:  Negative for abdominal pain, blood in stool, constipation, nausea and vomiting.  Genitourinary:  Negative for dysuria, frequency, hematuria and urgency.  Musculoskeletal:  Negative for joint pain and myalgias.  Neurological:  Negative for weakness.  Psychiatric/Behavioral:  Negative for depression. The patient does not have insomnia.    Current medications, allergy, social history, past surgical history, past medical history, family history were all reviewed.    Vitals:  BP 137/77 (BP Location: Left Arm, Patient Position: Sitting)   Pulse 98   Temp 98.1 F (36.7 C) (Oral)   Resp 16   Ht '5\' 3"'$  (1.6 m)   Wt 178 lb 6.4 oz (80.9 kg)   SpO2 97%   BMI 31.60 kg/m    Physical Exam:  Physical Exam Exam conducted with a chaperone present.  Constitutional:      General: She is not in acute distress. Cardiovascular:     Rate and Rhythm: Normal rate and regular rhythm.  Pulmonary:     Effort: Pulmonary effort is normal.     Breath sounds: Normal breath sounds. No wheezing or rhonchi.  Abdominal:     Palpations: Abdomen is soft.     Tenderness: There is no abdominal tenderness. There is no right CVA tenderness or left CVA tenderness.     Hernia: No hernia is present.   Genitourinary:    General: Left labia minora absent.  Right, perineal white plaque with regular borrders     Urethra: No urethral lesion.     Vagina: No lesions. No bleeding Musculoskeletal:     Cervical back: Neck supple.     Right lower leg: No edema.     Left lower leg: No edema.  Lymphadenopathy:     Upper Body:     Right upper body: No supraclavicular adenopathy.     Left upper body: No supraclavicular adenopathy.     Lower Body: No right inguinal adenopathy. No left inguinal adenopathy.  Skin:    Findings: No rash.  Neurological:     Mental Status: She is oriented to person, place, and time.       Procedure note: The right perineal skin was prepped with Betadine.  The skin was infiltrated with Lidocaine.  A 3 mm punch biopsy performed per protocol Silver Nitrate applied:  Yes Well tolerated  Specimen appropriately identified and sent to pathology   Assessment/Plan:  Vulvar cancer [facility]) 81  y.o.  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 Exam suspicious for recurrent VIN   > review the histology from the bx, Pap result > return prn   I personally spent 30 minutes face-to-face and non-face-to-face  in the care of this patient, which includes all pre, intra, and post visit time on the date of service.   Lahoma Crocker, MD

## 2022-04-21 NOTE — Assessment & Plan Note (Signed)
81  y.o.  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 Negative symptom review, normal exam.  No evidence of recurrence    Recommend annual follow-up with Pap testing; follow-up w/a generalist is appropriate.

## 2022-04-22 ENCOUNTER — Other Ambulatory Visit (HOSPITAL_COMMUNITY)
Admission: RE | Admit: 2022-04-22 | Discharge: 2022-04-22 | Disposition: A | Discharge status: Home / Self Care | Payer: PPO | Source: Ambulatory Visit | Attending: Obstetrics & Gynecology | Admitting: Obstetrics & Gynecology

## 2022-04-22 ENCOUNTER — Inpatient Hospital Stay: Payer: PPO | Attending: Obstetrics & Gynecology | Admitting: Obstetrics & Gynecology

## 2022-04-22 VITALS — BP 137/77 | HR 98 | Temp 98.1°F | Resp 16 | Ht 63.0 in | Wt 178.4 lb

## 2022-04-22 DIAGNOSIS — C519 Malignant neoplasm of vulva, unspecified: Secondary | ICD-10-CM

## 2022-04-22 DIAGNOSIS — Z1151 Encounter for screening for human papillomavirus (HPV): Secondary | ICD-10-CM | POA: Diagnosis not present

## 2022-04-22 DIAGNOSIS — Z01411 Encounter for gynecological examination (general) (routine) with abnormal findings: Secondary | ICD-10-CM | POA: Diagnosis not present

## 2022-04-22 DIAGNOSIS — Z8742 Personal history of other diseases of the female genital tract: Secondary | ICD-10-CM | POA: Insufficient documentation

## 2022-04-22 DIAGNOSIS — Z8619 Personal history of other infectious and parasitic diseases: Secondary | ICD-10-CM | POA: Diagnosis not present

## 2022-04-22 DIAGNOSIS — Z8544 Personal history of malignant neoplasm of other female genital organs: Secondary | ICD-10-CM | POA: Diagnosis not present

## 2022-04-22 DIAGNOSIS — D071 Carcinoma in situ of vulva: Secondary | ICD-10-CM | POA: Diagnosis not present

## 2022-04-22 NOTE — Patient Instructions (Signed)
Return as needed or in 6 monts

## 2022-04-28 LAB — SURGICAL PATHOLOGY

## 2022-04-28 LAB — CYTOLOGY - PAP
Comment: NEGATIVE
Diagnosis: UNDETERMINED — AB
High risk HPV: NEGATIVE

## 2022-06-25 DIAGNOSIS — L230 Allergic contact dermatitis due to metals: Secondary | ICD-10-CM | POA: Diagnosis not present

## 2022-06-25 DIAGNOSIS — Z8544 Personal history of malignant neoplasm of other female genital organs: Secondary | ICD-10-CM | POA: Diagnosis not present

## 2022-06-25 DIAGNOSIS — L299 Pruritus, unspecified: Secondary | ICD-10-CM | POA: Diagnosis not present

## 2022-06-25 DIAGNOSIS — M8588 Other specified disorders of bone density and structure, other site: Secondary | ICD-10-CM | POA: Diagnosis not present

## 2022-06-25 DIAGNOSIS — I10 Essential (primary) hypertension: Secondary | ICD-10-CM | POA: Diagnosis not present

## 2022-06-25 DIAGNOSIS — N1831 Chronic kidney disease, stage 3a: Secondary | ICD-10-CM | POA: Diagnosis not present

## 2022-06-25 DIAGNOSIS — M545 Low back pain, unspecified: Secondary | ICD-10-CM | POA: Diagnosis not present

## 2022-06-25 DIAGNOSIS — K219 Gastro-esophageal reflux disease without esophagitis: Secondary | ICD-10-CM | POA: Diagnosis not present

## 2022-06-25 DIAGNOSIS — E559 Vitamin D deficiency, unspecified: Secondary | ICD-10-CM | POA: Diagnosis not present

## 2022-06-25 DIAGNOSIS — F419 Anxiety disorder, unspecified: Secondary | ICD-10-CM | POA: Diagnosis not present

## 2022-06-25 DIAGNOSIS — E7800 Pure hypercholesterolemia, unspecified: Secondary | ICD-10-CM | POA: Diagnosis not present

## 2022-07-29 DIAGNOSIS — H43393 Other vitreous opacities, bilateral: Secondary | ICD-10-CM | POA: Diagnosis not present

## 2022-07-29 DIAGNOSIS — Z961 Presence of intraocular lens: Secondary | ICD-10-CM | POA: Diagnosis not present

## 2022-07-29 DIAGNOSIS — H3581 Retinal edema: Secondary | ICD-10-CM | POA: Diagnosis not present

## 2022-07-29 DIAGNOSIS — I10 Essential (primary) hypertension: Secondary | ICD-10-CM | POA: Diagnosis not present

## 2022-08-17 DIAGNOSIS — H353113 Nonexudative age-related macular degeneration, right eye, advanced atrophic without subfoveal involvement: Secondary | ICD-10-CM | POA: Diagnosis not present

## 2022-08-17 DIAGNOSIS — H35033 Hypertensive retinopathy, bilateral: Secondary | ICD-10-CM | POA: Diagnosis not present

## 2022-08-17 DIAGNOSIS — H353122 Nonexudative age-related macular degeneration, left eye, intermediate dry stage: Secondary | ICD-10-CM | POA: Diagnosis not present

## 2022-08-17 DIAGNOSIS — H43813 Vitreous degeneration, bilateral: Secondary | ICD-10-CM | POA: Diagnosis not present

## 2022-08-25 ENCOUNTER — Telehealth: Payer: Self-pay

## 2022-08-25 NOTE — Telephone Encounter (Signed)
LVM for patient to call office regarding needing a follow up appointment with Dr. Delsa Sale.

## 2022-08-25 NOTE — Telephone Encounter (Signed)
-----   Message from Dorothyann Gibbs, NP sent at 08/25/2022  1:04 PM EDT ----- Please call the patient to come in sooner than May appt. Prob on April 3. Given there were some precancerous changes on the vulva at her last visit, we would like to see her earlier.

## 2022-08-26 NOTE — Telephone Encounter (Signed)
Pt has rescheduled to an earlier appointment on 4/3'@1'$ :59. Date/time agreed by patient

## 2022-08-26 NOTE — Telephone Encounter (Signed)
LVM for patient to call office to reschedule appointment from 5/1 to 4/3 with Dr.Jackson-Moore

## 2022-09-16 ENCOUNTER — Encounter: Payer: Self-pay | Admitting: Obstetrics & Gynecology

## 2022-09-16 ENCOUNTER — Other Ambulatory Visit: Payer: Self-pay

## 2022-09-16 ENCOUNTER — Inpatient Hospital Stay: Payer: PPO | Attending: Obstetrics & Gynecology | Admitting: Obstetrics & Gynecology

## 2022-09-16 ENCOUNTER — Inpatient Hospital Stay (HOSPITAL_BASED_OUTPATIENT_CLINIC_OR_DEPARTMENT_OTHER): Payer: PPO | Admitting: Gynecologic Oncology

## 2022-09-16 VITALS — BP 134/64 | HR 100 | Temp 98.1°F | Resp 18 | Ht 63.0 in | Wt 173.8 lb

## 2022-09-16 DIAGNOSIS — D071 Carcinoma in situ of vulva: Secondary | ICD-10-CM | POA: Insufficient documentation

## 2022-09-16 DIAGNOSIS — C519 Malignant neoplasm of vulva, unspecified: Secondary | ICD-10-CM

## 2022-09-16 NOTE — Progress Notes (Signed)
Patient here for a pre-operative appointment prior to her scheduled surgery. She is scheduled for a laser of the vulva.  The surgery was discussed in detail.  See after visit summary for additional details. Visual aids used to discuss items related to surgery including the incentive spirometer, sequential compression stockings, foley catheter, IV pump, multi-modal pain regimen including tylenol, photo of the surgical robot, female reproductive system to discuss surgery in detail.      Discussed post-op pain management in detail including the aspects of the enhanced recovery pathway.  We discussed the use of tylenol post-op and to monitor for a maximum of 4,000 mg in a 24 hour period.  Also sennakot will be prescribed to be used after surgery and to hold if having loose stools.  Discussed bowel regimen in detail.     Discussed the use of SCDs and measures to take at home to prevent DVT including frequent mobility.  Reportable signs and symptoms of DVT discussed. Post-operative instructions discussed and expectations for after surgery. Incisional care discussed as well including reportable signs and symptoms including erythema, drainage, wound separation.     30 minutes spent with the patient.  Verbalizing understanding of material discussed. No needs or concerns voiced at the end of the visit.   Advised patient to call for any needs.  Advised that her post-operative medications had been prescribed and could be picked up at any time.    This appointment is included in the global surgical bundle as pre-operative teaching and has no charge.

## 2022-09-16 NOTE — Progress Notes (Signed)
Follow Up Note: Gyn-Onc  Andrea Gomez 82 y.o. female  CC: She presents for a follow-up visit   HPI: The oncology history was reviewed.  Interval History: She denies any new lesions, itching, leg/groin pain, urinary symptoms, leg swelling, weight loss or cough.  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    Review of Systems  Review of Systems  Constitutional:  Negative for malaise/fatigue and weight loss.  Respiratory:  Negative for shortness of breath and wheezing.   Cardiovascular:  Negative for chest pain and leg swelling.  Gastrointestinal:  Negative for abdominal pain, blood in stool, constipation, nausea and vomiting.  Genitourinary:  Negative for dysuria, frequency, hematuria and urgency.  Musculoskeletal:  Negative for joint pain and myalgias.  Neurological:  Negative for weakness.  Psychiatric/Behavioral:  Negative for depression. The patient does not have insomnia.    Current medications, allergy, social history, past surgical history, past medical history, family history were all reviewed.    Vitals:  BP 134/64 Comment: manual recheck  Pulse 100   Temp 98.1 F (36.7 C) (Oral)   Resp 18   Ht 5\' 3"  (1.6 m)   Wt 173 lb 12.8 oz (78.8 kg)   SpO2 97%   BMI 30.79 kg/m     Physical Exam:  Physical Exam Exam conducted with a chaperone present.  Constitutional:      General: She is not in acute distress. Cardiovascular:     Rate and Rhythm: Normal rate and regular rhythm.  Pulmonary:     Effort: Pulmonary effort is normal.     Breath sounds: Normal breath sounds. No wheezing or rhonchi.  Abdominal:     Palpations: Abdomen is soft.     Tenderness: There is no abdominal tenderness. There is no right CVA tenderness or left CVA tenderness.     Hernia: No hernia is present.  Genitourinary:    General: Left labia minora absent.  Right, perineal white plaque with regular borrders  Unchanged from the prior exam    Urethra: No urethral lesion.     Vagina: No lesions.  No bleeding Musculoskeletal:     Cervical back: Neck supple.     Right lower leg: No edema.     Left lower leg: No edema.  Lymphadenopathy:     Upper Body:     Right upper body: No supraclavicular adenopathy.     Left upper body: No supraclavicular adenopathy.     Lower Body: No right inguinal adenopathy. No left inguinal adenopathy.  Skin:    Findings: No rash.  Neurological:     Mental Status: She is oriented to person, place, and time.        Assessment/Plan:   Assessment: The patient is an 82 year old with recurrent VIN 3 with a history of a stage Ia vulvar malignancy.  The planned procedure is a laser ablation procedure.  Verbal consent was obtained for these procedures.  Plan: We reviewed the patient's specific anatomic and functional findings with her, with the assistance of diagrams and handouts.  We reviewed the treatment options including expectant, conservative, medical, and surgical management and she is opting for surgical management.  We reviewed the benefits and risks of each treatment option.   For all procedures, we discussed risks of bleeding, infection, damage to surrounding organs including bowel, bladder,  need for further surgery numbness and weakness at any body site and the rarer risks of blood clot, heart attack, pneumonia, death.  All her questions were answered and she verbalized understanding.    Pre-operative instructions:  She was instructed to not take Aspirin/NSAIDs x 7 days prior to surgery.   . Post-operative instructions:  She was given a set of handouts with specific post-operative instructions, including precautions and signs/symptoms for which we would recommend contacting us.   Preoperative clearance/medical evaluation:  She does not require surgical clearance/medical evaluation.    Post-operative follow-up:  A post-operative appointment will be made for 2 weeks following the procedure  I personally spent 30 minutes face-to-face and  non-face-to-face in the care of this patient, which includes all pre, intra, and post visit time on the date of service.   Lahoma Crocker, MD

## 2022-09-16 NOTE — Patient Instructions (Addendum)
What is laser of the vulva? This is a surgery where your doctor will use a laser light to remove unhealthy tissue. Why is this surgery used? To treat diseases of the vulva  Vulvar Intraepithelial Neoplasia (VIN) How do I prepare for surgery?  Before surgery, a pre-op appointment will be scheduled with your doctor at their office or with a nurse practitioner or physician assistant at [facility].  Depending on your health, we may ask you to see your primary doctor, a specialist, and/or an anesthesiologist to make sure you are healthy for surgery.  The lab work for your surgery must be done at least 3 days before surgery.  Some medications need to be stopped before the surgery. A list of medications will be provided at your pre-operative appointment.  Smoking can affect your surgery and recovery. Smokers may have difficulty breathing during the surgery and tend to heal more slowly after surgery. If you are a smoker, it is best to quit 6-8 weeks before surgery. If you are unable to stop smoking before surgery, your doctor can order a nicotine patch while you are in the hospital.  - 1 -  You will be told at your pre-op visit whether you will need a bowel prep for your surgery and if you do, what type you will use. The prep to clean your bowel will have to be completed the night before your surgery.  You will need to shower at home before surgery. Instructions will be provided at your pre-operative appointment.  Do not wear makeup, nail polish, lotion, deodorant, or antiperspirant on the day of surgery.  Remove all body piercings and acrylic nails.  If you have a "Living Will" or an "Advance Directive", bring a copy with you to the hospital on the day of surgery.  Most women recover and are back to most activities in 2-4 weeks. You may need a family member or a friend to help with your day-to-day activities for a few days after surgery. What can I expect during the surgery?  In the  operating room, you will receive either a general anesthesia, or a spinal anesthesia, or a local anesthesia. The choice of anesthesia is a decision that will be made by the anesthesiologist based upon your history and your wishes.  If you received general anesthesia the following will occur after you are asleep and before the surgery starts: o A tube to help you breathe will be placed in your throat. o Another tube will be placed in your stomach to remove any gas or other contents to reduce the likelihood of injury during the surgery. The tube is usually removed before you wake up. o A catheter will be inserted into your bladder to drain urine and to monitor the amount of urine coming out during surgery. The catheter will stay in until the next day. o Compression stockings will be placed on your legs to prevent blood clots in your legs and lungs during surgery. The stockings will stay  Laser of the Vulva - 2 - on until you are actively walking. If you are at a high risk for blood clots, a blood thinning medication (Heparin) may be given to you during your hospital stay.  The doctor will then use the laser light to remove the unhealthy tissue.  Photographs may be taken during the surgery and may be placed in your medical records. What are possible risks from this surgery? Although there can be problems that result from surgery, we work very  hard to make sure it is as safe as possible. However, problems can occur, even when things go as planned. You should be aware of these possible problems, how often they happen, and what will be done to correct them. Possible risks during surgery include:  Bleeding: If there is excessive bleeding, you will receive a blood transfusion. If you have personal or religious reasons for not wanting a transfusion, you must discuss this with your doctor prior to surgery.  Burns: To healthy tissue around the area.  Damage to the bladder, anus, and rectum:  Damage occurs in less than 1% of surgeries. If there is damage to the bladder, or the rectum they will be repaired while you are in surgery.  Death: All surgeries have a risk of death. Some surgeries have a higher risk than others. Possible risks that can occur days to weeks after surgery:  A blood clot in the legs or lung: Swelling or pain, shortness of breath, or chest pain are signs of blood clots. Call you doctor immediately if any of these occur.  Infection: Fever, redness, swelling or pain at the site of surgery.  Incision opens: Separation of the wound or lack of healing. Department of Obstetrics and Gynecology Laser of the Vulva - 3 -  Fistula: Is an abnormal connection between an organ, vessel, or intestine and another structure.  Pain: Worst is 2-5 days after surgery.  Scar tissue: Tissue thicker than normal skin forms at the site of surgery. Vagina or anus can become too tight. What happens after the surgery?  You will be taken to the recovery room and monitored for a short time before going home.  You will be given medications for pain and nausea.  You will have the compression stockings on your legs to improve circulation.  You may have some spotting of bright red, brown, or black discharge.  You will be given a small plastic device to help your breathing after your surgery at your bedside to help expand your lungs while you're in bed.  You will start walking as soon as possible after the surgery to help healing and recovery. When will I go home after surgery? Most women are able to go home the same day the surgery is done. You must arrange for someone to come with you, stay while you are having surgery and drive you home afterwards.  You will not be able to have your surgery if there is no-one with you. How will I care for myself at home after surgery? Laser of the Vulva - 4 - Call your doctor right away if you:  develop a fever over 100.13F (38C)  start  bleeding like a menstrual period or (and) are changing a pad every hour  have heavy vaginal discharge with a bad odor  have nausea and vomiting  have chest pain or difficulty breathing  develop swelling, redness, or pain in your legs  develop a rash  have pain with urination Vulva: Redness and swelling are common the first 12-24 hours after surgery. If the small lips of the vulva were lasered, use gentle traction to separate daily to prevent them from attaching to each other. You will have vulvar drainage for several days to weeks after your surgery. Post Laser Care:  Apply cool gel packs for the first 12-24 hours. Apply for 15-20 minutes at 1-2 hour intervals.  Take a sitz bath 3 times a day with warm water and Instant Ocean, sea salt, or Epsom salt. You can  also place 2 tablespoons of salt in a half full tub. You should soak for 15-20 minutes and not to exceed 3 times a day.  Pat the area dry with a clean cloth.  Apply Silvadene cream (NOT to be used if allergic to sulfa), Carrington's gel, or Bacitracin after each soak.  After each void or bowel movement clean the area with salt water solution: o 9% sodium chloride solution is available at pharmacies, or o Mix 1 tablespoon of salt with 1 quart of boiling water, allowing to cool completely prior to using.  Laser of the Vulva - 5 -  Warm tea bags, witch hazel pads, tea tree oil, or Vitamin E can offer soothing relief.  Do not wear underwear when you are sleeping. Diet: You will continue with your regular diet. Medications:  Pain: Medication for pain will be prescribed for you after surgery. Do not take it more frequently than instructed.  Stool softener: Narcotic pain medications may cause constipation. A stool softener may be needed while taking these medications. Activities:  Energy level: It is normal to have a decreased energy level after surgery. During the first week at home, you should minimize any strenuous. It  is important not to overdo, but once you settle into a normal routine at home, you will find that you slowly begin to feel better. Walking around the house and taking short walks outside can help you get back to your normal energy level more quickly.  Showers: Showers are allowed within 24 hours after your surgery.  Climbing: Climbing stairs is permitted, but you may require some assistance initially.  Lifting: For 4-6 weeks after your surgery you should not lift anything heavier than a gallon of milk. This includes pushing objects such as a vacuum cleaner and vigorous exercise.  Driving: The reason you are asked not to drive after surgery is because you may be prescribed pain medications. Even after you stop taking pain medication; driving is restricted because you may not be able to make sudden movements due to discomforts from surgery.  Laser of the Vulva - 6 - Disclaimer: This document contains information and/or instructional materials developed by the [facility]) for the typical patient with your condition. It may include links to online content that was not created by [facility] and for which [facility] does not assume responsibility. It does not replace medical advice from your health care provider because your experience may differ from that of the typical patient. Talk to your health care provider if you have any questions about this document, your condition or your treatment plan.  Exercise: Exercise is important for a healthy lifestyle. You may begin normal physical activity within hours of surgery. Start with short walks and gradually increase the distance and length of time that you walk. To allow your body time to heal, you should not return to a more difficult exercise routine for the first two weeks after your surgery. Please talk to your doctor about when you can begin exercising again.  Intercourse: No sexual activity until the area is healed and  comfortable.  Work: Most patients can return to work between 2-4 weeks after surgery. You may continue to feel tired for a couple of weeks. Follow-up with your doctor: You should have a post-operative appointment scheduled with your doctor for 8 weeks after surgery. If you have any further questions or concerns about getting ready for surgery, the surgery itself, or after the surgery, please talk with your doctor.  Preparing for your Surgery  Plan for surgery with Dr. Delsa Gomez at [place] will be scheduled for a laser of the vulva.   Pre-operative Testing -You will receive a phone call from presurgical testing at [facility] to discuss surgery instructions and arrange for lab work if needed.  -Bring your insurance card, copy of an advanced directive if applicable, medication list.  -You should not be taking blood thinners or aspirin at least ten days prior to surgery unless instructed by your surgeon.  -Do not take supplements such as fish oil (omega 3), red yeast rice, turmeric before your surgery. You want to avoid medications with aspirin in them including headache powders such as BC or Goody's), Excedrin migraine.  Day Before Surgery at [place] will be advised you can have clear liquids up until 3 hours before your surgery.    Your role in recovery Your role is to become active as soon as directed by your doctor, while still giving yourself time to heal.  Rest when you feel tired. You will be asked to do the following in order to speed your recovery:  - Cough and breathe deeply. This helps to clear and expand your lungs and can prevent pneumonia after surgery.  - [place]. Do mild physical activity. Walking or moving your legs help your circulation and body functions return to normal. Do not try to get up or walk alone the first time after surgery.   -If you develop swelling on one leg or the other, pain in the back of your  leg, redness/warmth in one of your legs, please call the office or go to the Emergency Room to have a doppler to rule out a blood clot. For shortness of breath, chest pain-seek care in the Emergency Room as soon as possible. - Actively manage your pain. Managing your pain lets you move in comfort. We will ask you to rate your pain on a scale of zero to 10. It is your responsibility to tell your doctor or nurse where and how much you hurt so your pain can be treated.  Special Considerations -Your final pathology results from surgery should be available around one week after surgery and the results will be relayed to you when available.  -FMLA forms can be faxed to [phone] and please allow 5-7 business days for completion.  Pain Management After Surgery -You will be prescribed your pain medication and bowel regimen medications before surgery so that you can have these available when you are discharged from the hospital. The pain medication is for use ONLY AFTER surgery and a new prescription will not be given.   -Make sure that you have Tylenol and Ibuprofen at home IF [place] to use on a regular basis after surgery for pain control. We recommend alternating the medications every hour to six hours since they work differently and are processed in the body differently for pain relief.  -Review the attached handout on narcotic use and their risks and side effects.   Bowel Regimen -You will be prescribed Sennakot-S to take nightly to prevent constipation especially if you are taking the narcotic pain medication intermittently.  It is important to prevent constipation and drink adequate amounts of liquids. You can stop taking this medication when you are not taking pain medication and you are back on your normal bowel routine.  Risks of Surgery Risks of surgery are low but include bleeding,  infection, damage to surrounding structures, re-operation, blood clots, and  very rarely death.  AFTER SURGERY INSTRUCTIONS  Return to work:  2-4 weeks if applicable  Activity: 1. Be up and out of the bed during the day.  Take a nap if needed.  You may walk up steps but be careful and use the hand rail.  Stair climbing will tire you more than you think, you may need to stop part way and rest.   2. No lifting or straining for 4-6 weeks over 10 pounds. No pushing, pulling, straining for 4.6 weeks.  3. No driving for minimum 24 after surgery.  Do not drive if you are taking narcotic pain medicine and make sure that your reaction time has returned.   4. You can shower as soon as the next day after surgery. Shower daily. No tub baths or submerging your body in water until cleared by your surgeon. If you have the soap that was given to you by pre-surgical testing that was used before surgery, you do not need to use it afterwards because this can irritate your incisions.   5. No sexual activity and nothing in the vagina for 6 weeks and cleared by Dr. Delsa Gomez.  6. You may experience vaginal spotting and discharge after surgery.  The spotting is normal but if you experience heavy bleeding, call our office.  7. Take Tylenol or ibuprofen first for pain if you are able to take these medications and only use narcotic pain medication for severe pain not relieved by the Tylenol or Ibuprofen.  Monitor your Tylenol intake to a max of 4,000 mg in a 24 hour period. You can alternate these medications after surgery.  8. Purchase a bag of frozen peas and divide it until Ziploc bags and place in the freezer to use after surgery.  Also use a peribottle to rinse the area with warm water after urinating.  Diet: 1. Low sodium Heart Healthy Diet is recommended but you are cleared to resume your normal (before surgery) diet after your procedure.  2. It is safe to use a laxative, such as Miralax or Colace, if you have difficulty moving your bowels. You have been prescribed Sennakot at  bedtime every evening to keep bowel movements regular and to prevent constipation.    Wound Care: 1. Keep clean and dry.  Shower daily.  Reasons to call the Doctor: Fever - Oral temperature greater than 100.4 degrees Fahrenheit Foul-smelling vaginal discharge Difficulty urinating Nausea and vomiting Increased pain at the site of the incision that is unrelieved with pain medicine. Difficulty breathing with or without chest pain New calf pain especially if only on one side Sudden, continuing increased vaginal bleeding with or without clots.   Contacts: For questions or concerns you should contact:  Dr. Delsa Gomez at [place], NP at [phone]  After Hours: call [phone] and have the GYN Oncologist paged/contacted (after 5 pm or on the weekends).  Messages sent via mychart are for non-urgent matters and are not responded to after hours so for urgent needs, please call the after hours number.

## 2022-09-23 ENCOUNTER — Other Ambulatory Visit: Payer: Self-pay | Admitting: Gynecologic Oncology

## 2022-09-23 ENCOUNTER — Telehealth: Payer: Self-pay

## 2022-09-23 DIAGNOSIS — D071 Carcinoma in situ of vulva: Secondary | ICD-10-CM

## 2022-09-23 DIAGNOSIS — Z8544 Personal history of malignant neoplasm of other female genital organs: Secondary | ICD-10-CM

## 2022-09-23 MED ORDER — HYDROCODONE-ACETAMINOPHEN 5-325 MG PO TABS: 1.0000 | ORAL_TABLET | Freq: Four times a day (QID) | ORAL | 0 refills | Status: DC | PRN

## 2022-09-23 MED ORDER — SENNOSIDES-DOCUSATE SODIUM 8.6-50 MG PO TABS: 1.0000 | ORAL_TABLET | Freq: Every day | ORAL | 0 refills | Status: DC

## 2022-09-23 NOTE — Telephone Encounter (Signed)
-----   Message from Doylene Bode, NP sent at 09/22/2022  4:34 PM EDT ----- Please let the patient know her surgery has been scheduled for May 1 around 11 AM at the [facility] outpatient surgery center.  She will receive a phone call closer to the date to discuss preoperative instructions.

## 2022-09-23 NOTE — Progress Notes (Signed)
Post-op meds sent in preop.  ?

## 2022-09-23 NOTE — Telephone Encounter (Signed)
Pt is aware of surgery being scheduled for May 1st. with post op 3-4 weeks after. Aware will get a call from [facility] regarding pre-admit testing. She agrees to date

## 2022-09-30 ENCOUNTER — Encounter (HOSPITAL_BASED_OUTPATIENT_CLINIC_OR_DEPARTMENT_OTHER): Payer: Self-pay | Admitting: Obstetrics & Gynecology

## 2022-10-02 ENCOUNTER — Encounter (HOSPITAL_BASED_OUTPATIENT_CLINIC_OR_DEPARTMENT_OTHER): Payer: Self-pay | Admitting: Obstetrics & Gynecology

## 2022-10-02 ENCOUNTER — Other Ambulatory Visit: Payer: Self-pay

## 2022-10-02 NOTE — Progress Notes (Signed)
Spoke w/ via phone for pre-op interview---pt Lab needs dos----   I stat, ekg           Lab results------none COVID test -----patient states asymptomatic no test needed Arrive at -------900 am 10-14-2022 NPO after MN NO Solid Food.  Clear liquids from MN until---800 am Med rec completed Medications to take morning of surgery -----amlodipine, omeprazole Diabetic medication -----n/a Patient instructed no nail polish to be worn day of surgery Patient instructed to bring photo id and insurance card day of surgery Patient aware to have Driver (ride ) / caregiver  husband benny  for 24 hours after surgery  Patient Special Instructions -----none Pre-Op special Instructions -----none Patient verbalized understanding of instructions that were given at this phone interview. Patient denies shortness of breath, chest pain, fever, cough at this phone interview.

## 2022-10-12 NOTE — H&P (Signed)
Follow Up Note: Gyn-Onc   Andrea Gomez 82 y.o. female   CC: She presents for a follow-up visit     HPI: The oncology history was reviewed.   Interval History: She denies any new lesions, itching, leg/groin pain, urinary symptoms, leg swelling, weight loss or cough.  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     Review of Systems  Review of Systems  Constitutional:  Negative for malaise/fatigue and weight loss.  Respiratory:  Negative for shortness of breath and wheezing.   Cardiovascular:  Negative for chest pain and leg swelling.  Gastrointestinal:  Negative for abdominal pain, blood in stool, constipation, nausea and vomiting.  Genitourinary:  Negative for dysuria, frequency, hematuria and urgency.  Musculoskeletal:  Negative for joint pain and myalgias.  Neurological:  Negative for weakness.  Psychiatric/Behavioral:  Negative for depression. The patient does not have insomnia.     Current medications, allergy, social history, past surgical history, past medical history, family history were all reviewed.       Vitals:  BP 134/64 Comment: manual recheck  Pulse 100   Temp 98.1 F (36.7 C) (Oral)   Resp 18   Ht 5\' 3"  (1.6 m)   Wt 173 lb 12.8 oz (78.8 kg)   SpO2 97%   BMI 30.79 kg/m       Physical Exam:  Physical Exam Exam conducted with a chaperone present.  Constitutional:      General: She is not in acute distress. Cardiovascular:     Rate and Rhythm: Normal rate and regular rhythm.  Pulmonary:     Effort: Pulmonary effort is normal.     Breath sounds: Normal breath sounds. No wheezing or rhonchi.  Abdominal:     Palpations: Abdomen is soft.     Tenderness: There is no abdominal tenderness. There is no right CVA tenderness or left CVA tenderness.     Hernia: No hernia is present.  Genitourinary:    General: Left labia minora absent.  Right, perineal white plaque with regular borrders  Unchanged from the prior exam    Urethra: No urethral lesion.      Vagina: No lesions. No bleeding Musculoskeletal:     Cervical back: Neck supple.     Right lower leg: No edema.     Left lower leg: No edema.  Lymphadenopathy:     Upper Body:     Right upper body: No supraclavicular adenopathy.     Left upper body: No supraclavicular adenopathy.     Lower Body: No right inguinal adenopathy. No left inguinal adenopathy.  Skin:    Findings: No rash.  Neurological:     Mental Status: She is oriented to person, place, and time.             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  The planned procedure is a laser ablation procedure.  Verbal consent was obtained for these procedures.   Plan: We reviewed the patient's specific anatomic and functional findings with her, with the assistance of diagrams and handouts.  We reviewed the treatment options including expectant, conservative, medical, and surgical management and she is opting for surgical management.  We reviewed the benefits and risks of each treatment option.    For all procedures, we discussed risks of bleeding, infection, damage to surrounding organs including bowel, bladder,  need for further surgery numbness and  weakness at any body site and the rarer risks of blood clot, heart attack, pneumonia, death.     All her questions were answered and she verbalized understanding.     Pre-operative instructions:  She was instructed to not take Aspirin/NSAIDs x 7 days prior to surgery.   . Post-operative instructions:  She was given a set of handouts with specific post-operative instructions, including precautions and signs/symptoms for which we would recommend contacting us.     Preoperative clearance/medical evaluation:  She does not require surgical clearance/medical evaluation.     Post-operative follow-up:  A post-operative appointment will be made for 2 weeks following the procedure   I personally  spent 30 minutes face-to-face and non-face-to-face in the care of this patient, which includes all pre, intra, and post visit time on the date of service.

## 2022-10-13 ENCOUNTER — Telehealth: Payer: Self-pay | Admitting: *Deleted

## 2022-10-13 NOTE — Telephone Encounter (Signed)
Telephone call to check on pre-operative status.  Patient compliant with pre-operative instructions.  Reinforced nothing to eat after midnight. Clear liquids until 0745. Patient to arrive at 0845.  No questions or concerns voiced.  Instructed to call for any needs.  °

## 2022-10-14 ENCOUNTER — Ambulatory Visit: Payer: PPO | Admitting: Obstetrics & Gynecology

## 2022-10-14 ENCOUNTER — Ambulatory Visit (HOSPITAL_BASED_OUTPATIENT_CLINIC_OR_DEPARTMENT_OTHER): Payer: PPO | Admitting: Certified Registered"

## 2022-10-14 ENCOUNTER — Other Ambulatory Visit: Payer: Self-pay

## 2022-10-14 ENCOUNTER — Encounter (HOSPITAL_BASED_OUTPATIENT_CLINIC_OR_DEPARTMENT_OTHER)
Admission: RE | Disposition: A | Discharge status: Home / Self Care | Payer: Self-pay | Source: Home / Self Care | Attending: Obstetrics & Gynecology

## 2022-10-14 ENCOUNTER — Encounter (HOSPITAL_BASED_OUTPATIENT_CLINIC_OR_DEPARTMENT_OTHER): Payer: Self-pay | Admitting: Obstetrics & Gynecology

## 2022-10-14 ENCOUNTER — Ambulatory Visit (HOSPITAL_BASED_OUTPATIENT_CLINIC_OR_DEPARTMENT_OTHER)
Admission: RE | Admit: 2022-10-14 | Discharge: 2022-10-14 | Disposition: A | Discharge status: Home / Self Care | Payer: PPO | Attending: Obstetrics & Gynecology | Admitting: Obstetrics & Gynecology

## 2022-10-14 DIAGNOSIS — Z87891 Personal history of nicotine dependence: Secondary | ICD-10-CM | POA: Diagnosis not present

## 2022-10-14 DIAGNOSIS — D071 Carcinoma in situ of vulva: Secondary | ICD-10-CM | POA: Insufficient documentation

## 2022-10-14 DIAGNOSIS — I129 Hypertensive chronic kidney disease with stage 1 through stage 4 chronic kidney disease, or unspecified chronic kidney disease: Secondary | ICD-10-CM

## 2022-10-14 DIAGNOSIS — I10 Essential (primary) hypertension: Secondary | ICD-10-CM | POA: Insufficient documentation

## 2022-10-14 DIAGNOSIS — N189 Chronic kidney disease, unspecified: Secondary | ICD-10-CM | POA: Diagnosis not present

## 2022-10-14 DIAGNOSIS — Z8544 Personal history of malignant neoplasm of other female genital organs: Secondary | ICD-10-CM | POA: Insufficient documentation

## 2022-10-14 DIAGNOSIS — N903 Dysplasia of vulva, unspecified: Secondary | ICD-10-CM | POA: Diagnosis not present

## 2022-10-14 DIAGNOSIS — Z01818 Encounter for other preprocedural examination: Secondary | ICD-10-CM

## 2022-10-14 DIAGNOSIS — C519 Malignant neoplasm of vulva, unspecified: Secondary | ICD-10-CM

## 2022-10-14 DIAGNOSIS — Z79899 Other long term (current) drug therapy: Secondary | ICD-10-CM | POA: Diagnosis not present

## 2022-10-14 HISTORY — DX: Papillomavirus as the cause of diseases classified elsewhere: B97.7

## 2022-10-14 HISTORY — DX: Irritable bowel syndrome without diarrhea: K58.9

## 2022-10-14 HISTORY — DX: Gastro-esophageal reflux disease without esophagitis: K21.9

## 2022-10-14 HISTORY — DX: Dermatitis, unspecified: L30.9

## 2022-10-14 HISTORY — DX: Chronic kidney disease, unspecified: N18.9

## 2022-10-14 HISTORY — PX: CO2 LASER APPLICATION: SHX5778

## 2022-10-14 HISTORY — DX: Essential (primary) hypertension: I10

## 2022-10-14 HISTORY — DX: Personal history of other diseases of the digestive system: Z87.19

## 2022-10-14 LAB — POCT I-STAT, CHEM 8
BUN: 27 mg/dL — ABNORMAL HIGH (ref 8–23)
Calcium, Ion: 1.24 mmol/L (ref 1.15–1.40)
Chloride: 103 mmol/L (ref 98–111)
Creatinine, Ser: 0.9 mg/dL (ref 0.44–1.00)
Glucose, Bld: 104 mg/dL — ABNORMAL HIGH (ref 70–99)
HCT: 46 % (ref 36.0–46.0)
Hemoglobin: 15.6 g/dL — ABNORMAL HIGH (ref 12.0–15.0)
Potassium: 4.2 mmol/L (ref 3.5–5.1)
Sodium: 142 mmol/L (ref 135–145)
TCO2: 28 mmol/L (ref 22–32)

## 2022-10-14 SURGERY — CO2 LASER APPLICATION
Anesthesia: General | Site: Vagina

## 2022-10-14 MED ORDER — DEXAMETHASONE SODIUM PHOSPHATE 10 MG/ML IJ SOLN
INTRAMUSCULAR | Status: DC | PRN
  Administered 2022-10-14: 5 mg via INTRAVENOUS

## 2022-10-14 MED ORDER — BUPIVACAINE HCL 0.25 % IJ SOLN
INTRAMUSCULAR | Status: DC | PRN
  Administered 2022-10-14: 10 mL

## 2022-10-14 MED ORDER — LIDOCAINE HCL 1 % IJ SOLN
INTRAMUSCULAR | Status: DC | PRN
  Administered 2022-10-14: 10 mL

## 2022-10-14 MED ORDER — PROPOFOL 10 MG/ML IV BOLUS
INTRAVENOUS | Status: DC | PRN
  Administered 2022-10-14: 140 mg via INTRAVENOUS

## 2022-10-14 MED ORDER — LIDOCAINE 2% (20 MG/ML) 5 ML SYRINGE
INTRAMUSCULAR | Status: DC | PRN
  Administered 2022-10-14: 60 mg via INTRAVENOUS

## 2022-10-14 MED ORDER — FENTANYL CITRATE (PF) 100 MCG/2ML IJ SOLN: 25.0000 ug | INTRAMUSCULAR | Status: DC | PRN

## 2022-10-14 MED ORDER — FENTANYL CITRATE (PF) 100 MCG/2ML IJ SOLN
INTRAMUSCULAR | Status: DC | PRN
  Administered 2022-10-14: 25 ug via INTRAVENOUS
  Administered 2022-10-14: 75 ug via INTRAVENOUS

## 2022-10-14 MED ORDER — DEXAMETHASONE SODIUM PHOSPHATE 10 MG/ML IJ SOLN
INTRAMUSCULAR | Status: AC
  Filled 2022-10-14: qty 1

## 2022-10-14 MED ORDER — 0.9 % SODIUM CHLORIDE (POUR BTL) OPTIME
TOPICAL | Status: DC | PRN
  Administered 2022-10-14: 500 mL

## 2022-10-14 MED ORDER — ONDANSETRON HCL 4 MG/2ML IJ SOLN
INTRAMUSCULAR | Status: AC
  Filled 2022-10-14: qty 2

## 2022-10-14 MED ORDER — FENTANYL CITRATE (PF) 100 MCG/2ML IJ SOLN
INTRAMUSCULAR | Status: AC
  Filled 2022-10-14: qty 2

## 2022-10-14 MED ORDER — SODIUM CHLORIDE 0.9 % IV SOLN: INTRAVENOUS | Status: DC

## 2022-10-14 MED ORDER — DEXAMETHASONE SODIUM PHOSPHATE 10 MG/ML IJ SOLN: 4.0000 mg | INTRAMUSCULAR | Status: DC

## 2022-10-14 MED ORDER — ACETIC ACID 5 % SOLN
Status: DC | PRN
  Administered 2022-10-14: 1 via TOPICAL

## 2022-10-14 MED ORDER — ACETAMINOPHEN 500 MG PO TABS
1000.0000 mg | ORAL_TABLET | ORAL | Status: AC
  Administered 2022-10-14: 1000 mg via ORAL

## 2022-10-14 MED ORDER — SILVER SULFADIAZINE 1 % EX CREA: TOPICAL_CREAM | Freq: Two times a day (BID) | CUTANEOUS | Status: DC

## 2022-10-14 MED ORDER — SILVER NITRATE-POT NITRATE 75-25 % EX MISC
CUTANEOUS | Status: DC | PRN
  Administered 2022-10-14: 3

## 2022-10-14 MED ORDER — ONDANSETRON HCL 4 MG/2ML IJ SOLN
INTRAMUSCULAR | Status: DC | PRN
  Administered 2022-10-14: 4 mg via INTRAVENOUS

## 2022-10-14 MED ORDER — SILVER SULFADIAZINE 1 % EX CREA
TOPICAL_CREAM | CUTANEOUS | Status: DC | PRN
  Administered 2022-10-14: 1 via TOPICAL

## 2022-10-14 MED ORDER — LIDOCAINE HCL (PF) 2 % IJ SOLN
INTRAMUSCULAR | Status: AC
  Filled 2022-10-14: qty 5

## 2022-10-14 MED ORDER — ACETAMINOPHEN 500 MG PO TABS
ORAL_TABLET | ORAL | Status: AC
  Filled 2022-10-14: qty 2

## 2022-10-14 SURGICAL SUPPLY — 33 items
BLADE CLIPPER SENSICLIP SURGIC (BLADE) IMPLANT
BLADE SURG 15 STRL LF DISP TIS (BLADE) IMPLANT
BLADE SURG 15 STRL SS (BLADE)
CATH ROBINSON RED A/P 16FR (CATHETERS) ×1 IMPLANT
GAUZE 4X4 16PLY ~~LOC~~+RFID DBL (SPONGE) ×2 IMPLANT
GLOVE BIO SURGEON STRL SZ 6 (GLOVE) ×2 IMPLANT
GLOVE BIO SURGEON STRL SZ 6.5 (GLOVE) IMPLANT
GLOVE BIOGEL PI IND STRL 7.0 (GLOVE) IMPLANT
GLOVE BIOGEL PI IND STRL 7.5 (GLOVE) IMPLANT
GOWN STRL REUS W/TWL LRG LVL3 (GOWN DISPOSABLE) ×1 IMPLANT
GOWN STRL REUS W/TWL XL LVL3 (GOWN DISPOSABLE) IMPLANT
KIT TURNOVER CYSTO (KITS) ×1 IMPLANT
NDL HYPO 25X1 1.5 SAFETY (NEEDLE) IMPLANT
NEEDLE HYPO 25X1 1.5 SAFETY (NEEDLE) ×1 IMPLANT
NS IRRIG 500ML POUR BTL (IV SOLUTION) ×1 IMPLANT
PACK PERINEAL COLD (PAD) ×1 IMPLANT
PACK VAGINAL WOMENS (CUSTOM PROCEDURE TRAY) ×1 IMPLANT
PENCIL BUTTON HOLSTER BLD 10FT (ELECTRODE) ×1 IMPLANT
PUNCH BIOPSY DERMAL 3 (INSTRUMENTS) IMPLANT
PUNCH BIOPSY DERMAL 3MM (INSTRUMENTS)
PUNCH BIOPSY DERMAL 4MM (INSTRUMENTS) IMPLANT
SLEEVE SCD COMPRESS KNEE MED (STOCKING) ×1 IMPLANT
SUT VIC AB 0 SH 27 (SUTURE) IMPLANT
SUT VIC AB 2-0 CT2 27 (SUTURE) IMPLANT
SUT VIC AB 2-0 SH 27 (SUTURE)
SUT VIC AB 2-0 SH 27XBRD (SUTURE) IMPLANT
SUT VIC AB 3-0 SH 27 (SUTURE) ×2
SUT VIC AB 3-0 SH 27X BRD (SUTURE) IMPLANT
SUT VIC AB 4-0 PS2 18 (SUTURE) ×1 IMPLANT
SWAB OB GYN 8IN STERILE 2PK (MISCELLANEOUS) IMPLANT
SYR BULB IRRIG 60ML STRL (SYRINGE) ×1 IMPLANT
TOWEL OR 17X24 6PK STRL BLUE (TOWEL DISPOSABLE) ×1 IMPLANT
VACUUM HOSE 7/8X10 W/ WAND (MISCELLANEOUS) ×1 IMPLANT

## 2022-10-14 NOTE — Interval H&P Note (Signed)
History and Physical Interval Note:  10/14/2022 10:54 AM  Andrea Gomez  has presented today for surgery, with the diagnosis of VULVAR DYSPLASIA HISTORY OF VULVAR CANCER.  The various methods of treatment have been discussed with the patient and family. After consideration of risks, benefits and other options for treatment, the patient has consented to  Procedure(s): CO2 LASER APPLICATION TO VULVA (N/A) as a surgical intervention.  The patient's history has been reviewed, patient examined, no change in status, stable for surgery.  I have reviewed the patient's chart and labs.  Questions were answered to the patient's satisfaction.     [name]

## 2022-10-14 NOTE — Anesthesia Procedure Notes (Addendum)
Procedure Name: LMA Insertion Date/Time: 10/14/2022 11:13 AM  Performed by: Francie Massing, CRNAPre-anesthesia Checklist: Patient identified, Emergency Drugs available, Suction available and Patient being monitored Patient Re-evaluated:Patient Re-evaluated prior to induction Oxygen Delivery Method: Circle system utilized Preoxygenation: Pre-oxygenation with 100% oxygen Induction Type: IV induction Ventilation: Mask ventilation without difficulty LMA: LMA inserted LMA Size: 3.0 Number of attempts: 2 Airway Equipment and Method: Bite block Placement Confirmation: positive ETCO2 Tube secured with: Tape Dental Injury: Teeth and Oropharynx as per pre-operative assessment  Comments: First attempt with #4 LMA - pt with small mouth and poor seal.  Second pass with #3 LMA, easy insertioin and good seal

## 2022-10-14 NOTE — Anesthesia Postprocedure Evaluation (Signed)
Anesthesia Post Note  Patient: Andrea Gomez  Procedure(s) Performed: CO2 LASER APPLICATION TO VULVA (Vagina )     Patient location during evaluation: PACU Anesthesia Type: General Level of consciousness: awake and alert Pain management: pain level controlled Vital Signs Assessment: post-procedure vital signs reviewed and stable Respiratory status: spontaneous breathing, nonlabored ventilation, respiratory function stable and patient connected to nasal cannula oxygen Cardiovascular status: blood pressure returned to baseline and stable Postop Assessment: no apparent nausea or vomiting Anesthetic complications: no   No notable events documented.  Last Vitals:  Vitals:   10/14/22 1245 10/14/22 1337  BP: (!) 180/79 (!) 154/79  Pulse: 83 86  Resp: 11 15  Temp:  36.4 C  SpO2: 93% 93%    Last Pain:  Vitals:   10/14/22 1337  TempSrc: Oral  PainSc: 0-No pain                 Earl Lites P Novalynn Branaman

## 2022-10-14 NOTE — Transfer of Care (Signed)
Immediate Anesthesia Transfer of Care Note  Patient: Andrea Gomez  Procedure(s) Performed: Procedure(s) (LRB): CO2 LASER APPLICATION TO VULVA (N/A)  Patient Location: PACU  Anesthesia Type: General  Level of Consciousness: awake, oriented, sedated and patient cooperative  Airway & Oxygen Therapy: Patient Spontanous Breathing and Patient connected to face mask oxygen  Post-op Assessment: Report given to PACU RN and Post -op Vital signs reviewed and stable  Post vital signs: Reviewed and stable  Complications: No apparent anesthesia complications Last Vitals:  Vitals Value Taken Time  BP    Temp    Pulse 87 10/14/22 1156  Resp 11 10/14/22 1156  SpO2 96 % 10/14/22 1156  Vitals shown include unvalidated device data.  Last Pain:  Vitals:   10/14/22 0916  TempSrc: Oral  PainSc: 0-No pain      Patients Stated Pain Goal: 5 (10/14/22 0916)  Complications: No notable events documented.

## 2022-10-14 NOTE — Discharge Instructions (Addendum)
10/14/2022  Plan on using the silvadene cream on the lasered area of the vulva at least twice daily until we see you back in the office for a check up. This is a burn cream and will help with healing.  Activity: 1. Be up and out of the bed during the day.  Take a nap if needed.  You may walk up steps but be careful and use the hand rail.  Stair climbing will tire you more than you think, you may need to stop part way and rest.   2. No lifting or straining over 10 lbs, pushing, pulling, straining for 1 week minimum.  3. No driving for 24 hours.  Do not drive if you are taking narcotic pain medicine. You need to make sure your reaction time has returned and you can brake safely.  4. Shower daily.  Use your regular soap to bathe and when finished pat your incision dry; don't rub.  No tub baths until cleared by your surgeon.   5. No sexual activity and nothing in the vagina for 4-6 weeks.  6. You may experience vulvar spotting after surgery. The spotting is normal but if you experience heavy bleeding, call our office.  7. Take Tylenol or ibuprofen first for pain and only use narcotic pain medication for severe pain not relieved by the Tylenol or Ibuprofen.  Monitor your Tylenol intake to a max of 4,000 mg.   Diet: 1. Low sodium Heart Healthy Diet is recommended.  2. It is safe to use a laxative, such as Miralax or Colace, if you have difficulty moving your bowels. You can take Sennakot at bedtime every evening to keep bowel movements regular and to prevent constipation.    Wound Care: 1. Keep clean and dry.  Shower daily.  Reasons to call the Doctor: Fever - Oral temperature greater than 100.4 degrees Fahrenheit Foul-smelling vaginal discharge Difficulty urinating Nausea and vomiting Increased pain at the site of the incision that is unrelieved with pain medicine. Difficulty breathing with or without chest pain New calf pain especially if only on one side Sudden, continuing increased  vaginal bleeding with or without clots.   Contacts: For questions or concerns you should contact:  Dr. [name] at [phone]  Warner Mccreedy, NP at [phone]  After Hours: call [phone] and have the GYN Oncologist paged/contacted  May take Tylenol beginning at 3 PM today as needed for discomfort/soreness.  Post Anesthesia Home Care Instructions  Activity: Get plenty of rest for the remainder of the day. A responsible individual must stay with you for 24 hours following the procedure.  For the next 24 hours, DO NOT: -Drive a car -Advertising copywriter -Drink alcoholic beverages -Take any medication unless instructed by your physician -Make any legal decisions or sign important papers.  Meals: Start with liquid foods such as gelatin or soup. Progress to regular foods as tolerated. Avoid greasy, spicy, heavy foods. If nausea and/or vomiting occur, drink only clear liquids until the nausea and/or vomiting subsides. Call your physician if vomiting continues.  Special Instructions/Symptoms: Your throat may feel dry or sore from the anesthesia or the breathing tube placed in your throat during surgery. If this causes discomfort, gargle with warm salt water. The discomfort should disappear within 24 hours.

## 2022-10-14 NOTE — Anesthesia Preprocedure Evaluation (Signed)
Anesthesia Evaluation  Patient identified by MRN, date of birth, ID band Patient awake    Reviewed: Allergy & Precautions, NPO status , Patient's Chart, lab work & pertinent test results  Airway Mallampati: II  TM Distance: >3 FB Neck ROM: Full    Dental no notable dental hx.    Pulmonary neg pulmonary ROS, former smoker   Pulmonary exam normal        Cardiovascular hypertension, Pt. on medications  Rhythm:Regular Rate:Normal     Neuro/Psych   Anxiety     negative neurological ROS     GI/Hepatic Neg liver ROS, hiatal hernia,GERD  ,,  Endo/Other  negative endocrine ROS    Renal/GU CRFRenal disease  Female GU complaint     Musculoskeletal negative musculoskeletal ROS (+)    Abdominal Normal abdominal exam  (+)   Peds  Hematology negative hematology ROS (+)   Anesthesia Other Findings   Reproductive/Obstetrics                             Anesthesia Physical Anesthesia Plan  ASA: 3  Anesthesia Plan: General   Post-op Pain Management: Tylenol PO (pre-op)*   Induction: Intravenous  PONV Risk Score and Plan: 3 and Ondansetron, Dexamethasone and Treatment may vary due to age or medical condition  Airway Management Planned: Mask and LMA  Additional Equipment: None  Intra-op Plan:   Post-operative Plan: Extubation in OR  Informed Consent: I have reviewed the patients History and Physical, chart, labs and discussed the procedure including the risks, benefits and alternatives for the proposed anesthesia with the patient or authorized representative who has indicated his/her understanding and acceptance.     Dental advisory given  Plan Discussed with: CRNA  Anesthesia Plan Comments:        Anesthesia Quick Evaluation

## 2022-10-14 NOTE — Op Note (Signed)
PREOPERATIVE DIAGNOSIS: Recurrent dysplasia of vulva.  POSTOPERATIVE DIAGNOSIS: Same.  SURGEON: [name], MD  ASSISTANT: Warner Mccreedy, PA  OPERATION PERFORMED: Carbon dioxide laser photo-ablation, perineal biopsy  ANESTHESIA: General, laryngeal mask; local   INDICATIONS FOR PROCEDURE: The patient has a past history of recurrent vulvar dysplasia. She has had multiple prior procedures for treatment. She was counseled to undergo laser photo-ablation.  FINDINGS: Examination under anesthesia revealed several slightly raised and pigmented lesions, predominantly on the right labia majora and perineal regions. After staining with acetic acid, several additional areas of acetowhite epithelium were seen on in the lower perineal/perianal region.  PROCEDURE: The patient was brought to the operating room with an IV in place. Anesthetic was administered, after which she was placed in the lithotomy position. Examination under anesthesia was performed, after which she was prepped and draped. Acetic acid was applied.The base of lesion was infiltrated with a mixture of 1% lidocaine and .25% Marcaine. The Bovie was utilized to outline the extent of the dysplastic lesion. The carbon dioxide laser was then used to ablate the lesion to a depth of 2 mm. Setting was 25 watts using a hand piece in the paint mode. Excellent hemostasis was noted and Silvadene was applied. The posterior lesion noted above was infiltrated with local anesthetic.  A 3 mm punch biopsy was taken.  The resulting ellipse was closed with a horizontal mattress suture of 3 - 0 Vicryl suture. The patient was awakened from her anesthetic and taken to the Post Anesthesia Care Unit in stable condition. An RFID scan was negative for retained sponges.

## 2022-10-15 ENCOUNTER — Encounter (HOSPITAL_BASED_OUTPATIENT_CLINIC_OR_DEPARTMENT_OTHER): Payer: Self-pay | Admitting: Obstetrics & Gynecology

## 2022-10-15 ENCOUNTER — Telehealth: Payer: Self-pay | Admitting: *Deleted

## 2022-10-15 LAB — SURGICAL PATHOLOGY

## 2022-10-15 NOTE — Telephone Encounter (Signed)
Spoke with Andrea Gomez this morning. She states she is eating, drinking and urinating well. She has not had a BM yet but is passing gas. She is taking senokot as prescribed and encouraged her to drink plenty of water. She denies fever or chills. She is using silvadene cream to surgical site. She rates her pain 0 /10. Her pain is controlled with Tylenol.    Instructed to call office with any fever, chills, purulent drainage, uncontrolled pain or any other questions or concerns. Patient verbalizes understanding.   Pt aware of post op appointments as well as the office number [phone] and after hours number [phone] to call if she has any questions or concerns

## 2022-10-23 ENCOUNTER — Telehealth: Payer: Self-pay

## 2022-10-23 NOTE — Telephone Encounter (Signed)
Pt called office today stating she has an appointment with Warner Mccreedy on 5/14 for a post-op. Pt states she has an appointment at 1:00 in [place] and asked if her appointment time could be moved up to an earlier time.   Appointment time changed to 9:30 on 5/14.  Warner Mccreedy NP notified.

## 2022-10-27 ENCOUNTER — Other Ambulatory Visit: Payer: Self-pay

## 2022-10-27 ENCOUNTER — Inpatient Hospital Stay: Payer: PPO | Attending: Obstetrics & Gynecology | Admitting: Gynecologic Oncology

## 2022-10-27 VITALS — BP 153/62 | HR 86 | Temp 98.0°F | Resp 16 | Ht 63.0 in | Wt 177.0 lb

## 2022-10-27 DIAGNOSIS — D071 Carcinoma in situ of vulva: Secondary | ICD-10-CM

## 2022-10-27 NOTE — Progress Notes (Signed)
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  More recently, on 10/14/2022, she underwent carbon dioxide laser ablation to the vulva, perineal biopsy with Dr. [name] for VIN 3. Biopsy from the perineum returned with lichenoid inflammation, negative for dysplasia or malignancy.   She presents today for post-operative follow up/vulvar check. She reports doing well since surgery. No pain reported. Tolerating diet with no nausea or emesis. Bladder and bowels functioning without difficulty. No excessive vulvar drainage or bleeding reported. No lower extremity edema. She has been applying tea tree oil on the lasered areas and has been doing sitz baths with epsom salt. She has plans to go to [facility] soon and would like to get in the water. No concerns voiced.  Exam: Alert, oriented, in no acute distress.  Lungs clear to auscultation.  Heart regular in rate and rhythm.  No lower extremity edema noted. With chaperone present, on vulvar inspection, the lasered area around the posterior fourchette is healing well with no surrounding erythema, no abnormal drainage or bleeding.  Wound bed is without exudate and pink.  Assessment/Plan: Mistica Deichmann is an 82 year old female status post laser application to the vulva on Oct 14, 2022 with Dr. [name] for VIN 3. She is doing well post-operatively. She is advised to continue with peri care. She has follow up scheduled with Dr. Tamela Oddi on 11/18/2022.  She is advised to call for any needs or new, worsening symptoms.  No needs or questions voiced at the end of the visit.

## 2022-10-27 NOTE — Patient Instructions (Signed)
You are healing well. Continue with your peri care and follow up as scheduled with Dr. Tamela Oddi in the next several weeks.  Please call [phone] for any needs, development of new symptoms, etc.

## 2022-11-17 NOTE — Progress Notes (Unsigned)
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  More recently, on 10/14/2022, she underwent carbon dioxide laser ablation to the vulva, perineal biopsy with Dr. [name] for VIN 3. Biopsy from the perineum returned with lichenoid inflammation, negative for dysplasia or malignancy.   She presents today for post-operative follow up/vulvar check. She reports doing well since surgery. No pain reported. Tolerating diet with no nausea or emesis. Bladder and bowels functioning without difficulty. No excessive vulvar drainage or bleeding reported. No lower extremity edema. She has been applying tea tree oil on the lasered areas and has been doing sitz baths with epsom salt. She has plans to go to [facility] soon and would like to get in the water. No concerns voiced.  Exam: Alert, oriented, in no acute distress.  Lungs clear to auscultation.  Heart regular in rate and rhythm.  No lower extremity edema noted. With chaperone present, on vulvar inspection, the lasered area around the posterior fourchette is healing well with no surrounding erythema, no abnormal drainage or bleeding.  Wound bed is without exudate and pink.  Assessment/Plan: Andrea Gomez is an 82 year old female status post laser application to the vulva on Oct 14, 2022 with Dr. [name] for VIN 3. She is doing well post-operatively. She is advised to continue with peri care. She has follow up scheduled with Dr. Tamela Oddi on 11/18/2022.  She is advised to call for any needs or new, worsening symptoms.  No needs or questions voiced at the end of the visit.  We suggest follow-up with a gynecologic examination (including visual inspection of the vulva) every six months for five years and then annually. Further evaluation with colposcopy and  biopsies is warranted if the patient exhibits symptoms and/or examination findings concerning for additional disease.

## 2022-11-18 ENCOUNTER — Inpatient Hospital Stay: Payer: PPO | Attending: Obstetrics & Gynecology | Admitting: Obstetrics & Gynecology

## 2022-11-18 ENCOUNTER — Other Ambulatory Visit: Payer: Self-pay

## 2022-11-18 ENCOUNTER — Encounter: Payer: Self-pay | Admitting: Obstetrics & Gynecology

## 2022-11-18 VITALS — BP 143/59 | HR 93 | Temp 98.0°F | Resp 16 | Ht 62.99 in | Wt 177.1 lb

## 2022-11-18 DIAGNOSIS — Z9889 Other specified postprocedural states: Secondary | ICD-10-CM

## 2022-11-18 DIAGNOSIS — C519 Malignant neoplasm of vulva, unspecified: Secondary | ICD-10-CM

## 2022-11-18 NOTE — Patient Instructions (Signed)
Return in 5 months.

## 2022-12-18 ENCOUNTER — Telehealth: Payer: Self-pay | Admitting: *Deleted

## 2022-12-18 NOTE — Telephone Encounter (Signed)
LMOM for the patient to call the office back. Patient needs to be scheduled for a follow up appt with Dr Tamela Oddi in Nov

## 2022-12-22 NOTE — Telephone Encounter (Signed)
Spoke with Andrea Gomez who returned a call from the office regarding a follow up appt. Patient was given an appointment on Wednesday, November 13th at 1 pm with Dr. Tamela Oddi. Pt agreed to date and time and had no questions or concerns at this time.

## 2023-01-06 DIAGNOSIS — I10 Essential (primary) hypertension: Secondary | ICD-10-CM | POA: Diagnosis not present

## 2023-01-06 DIAGNOSIS — Z961 Presence of intraocular lens: Secondary | ICD-10-CM | POA: Diagnosis not present

## 2023-01-06 DIAGNOSIS — H43391 Other vitreous opacities, right eye: Secondary | ICD-10-CM | POA: Diagnosis not present

## 2023-01-06 DIAGNOSIS — H353132 Nonexudative age-related macular degeneration, bilateral, intermediate dry stage: Secondary | ICD-10-CM | POA: Diagnosis not present

## 2023-01-08 DIAGNOSIS — E7800 Pure hypercholesterolemia, unspecified: Secondary | ICD-10-CM | POA: Diagnosis not present

## 2023-01-08 DIAGNOSIS — Z0000 Encounter for general adult medical examination without abnormal findings: Secondary | ICD-10-CM | POA: Diagnosis not present

## 2023-01-08 DIAGNOSIS — F419 Anxiety disorder, unspecified: Secondary | ICD-10-CM | POA: Diagnosis not present

## 2023-01-08 DIAGNOSIS — M8580 Other specified disorders of bone density and structure, unspecified site: Secondary | ICD-10-CM | POA: Diagnosis not present

## 2023-01-08 DIAGNOSIS — L304 Erythema intertrigo: Secondary | ICD-10-CM | POA: Diagnosis not present

## 2023-01-08 DIAGNOSIS — I10 Essential (primary) hypertension: Secondary | ICD-10-CM | POA: Diagnosis not present

## 2023-01-08 DIAGNOSIS — Z79899 Other long term (current) drug therapy: Secondary | ICD-10-CM | POA: Diagnosis not present

## 2023-01-11 ENCOUNTER — Other Ambulatory Visit: Payer: Self-pay | Admitting: Family Medicine

## 2023-01-11 DIAGNOSIS — M8580 Other specified disorders of bone density and structure, unspecified site: Secondary | ICD-10-CM

## 2023-01-19 DIAGNOSIS — E7800 Pure hypercholesterolemia, unspecified: Secondary | ICD-10-CM | POA: Diagnosis not present

## 2023-01-19 DIAGNOSIS — I10 Essential (primary) hypertension: Secondary | ICD-10-CM | POA: Diagnosis not present

## 2023-01-19 DIAGNOSIS — Z79899 Other long term (current) drug therapy: Secondary | ICD-10-CM | POA: Diagnosis not present

## 2023-01-19 DIAGNOSIS — M8580 Other specified disorders of bone density and structure, unspecified site: Secondary | ICD-10-CM | POA: Diagnosis not present

## 2023-01-28 DIAGNOSIS — B356 Tinea cruris: Secondary | ICD-10-CM | POA: Diagnosis not present

## 2023-02-11 DIAGNOSIS — B356 Tinea cruris: Secondary | ICD-10-CM | POA: Diagnosis not present

## 2023-03-05 DIAGNOSIS — H43813 Vitreous degeneration, bilateral: Secondary | ICD-10-CM | POA: Diagnosis not present

## 2023-03-05 DIAGNOSIS — H353133 Nonexudative age-related macular degeneration, bilateral, advanced atrophic without subfoveal involvement: Secondary | ICD-10-CM | POA: Diagnosis not present

## 2023-03-05 DIAGNOSIS — Z961 Presence of intraocular lens: Secondary | ICD-10-CM | POA: Diagnosis not present

## 2023-03-05 DIAGNOSIS — H35033 Hypertensive retinopathy, bilateral: Secondary | ICD-10-CM | POA: Diagnosis not present

## 2023-04-28 ENCOUNTER — Other Ambulatory Visit: Payer: Self-pay

## 2023-04-28 ENCOUNTER — Encounter: Payer: Self-pay | Admitting: Obstetrics & Gynecology

## 2023-04-28 ENCOUNTER — Inpatient Hospital Stay: Payer: PPO | Attending: Obstetrics & Gynecology | Admitting: Obstetrics & Gynecology

## 2023-04-28 VITALS — BP 140/62 | HR 92 | Temp 97.7°F | Resp 20 | Wt 176.8 lb

## 2023-04-28 DIAGNOSIS — Z8544 Personal history of malignant neoplasm of other female genital organs: Secondary | ICD-10-CM | POA: Insufficient documentation

## 2023-04-28 DIAGNOSIS — C519 Malignant neoplasm of vulva, unspecified: Secondary | ICD-10-CM

## 2023-04-28 DIAGNOSIS — D071 Carcinoma in situ of vulva: Secondary | ICD-10-CM | POA: Insufficient documentation

## 2023-04-28 NOTE — Patient Instructions (Signed)
Return in 6 months

## 2023-04-28 NOTE — Progress Notes (Signed)
Follow Up Note: Gyn-Onc  [name] 82 y.o. female  CC: She presents for a follow-up visit   HPI: The oncology history was reviewed. 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   More recently, on 10/14/2022, she underwent carbon dioxide laser ablation to the vulva, perineal biopsy for VIN 3. Biopsy from the perineum returned with lichenoid inflammation, negative for dysplasia or malignancy.    Interval History: She denies any new lesions, itching, leg/groin pain, urinary symptoms, leg swelling, weight loss or cough.    History of Present Illness The patient, known to have a history of recurrent skin issues in the perineal area, presented with a complaint of itching in the same region. They noticed the itching a couple of weeks ago, which was associated with a small bump. However, the bump and itching have since resolved. The patient has a history of similar skin issues, particularly in the summer months, which they attribute to increased moisture in the area due to adiposity. They have found relief with certain creams, likely antifungal in nature, mixed with a mild steroid. The patient reported that powders have not been effective in managing their symptoms. They have also been advised to keep the area dry, using methods such as a hairdryer on a cool setting and wearing breathable cotton clothing.   Review of Systems  Review of Systems  Constitutional:  Negative for malaise/fatigue and weight loss.  Respiratory:  Negative for shortness of breath and wheezing.   Cardiovascular:  Negative for chest pain and leg swelling.  Gastrointestinal:  Negative for abdominal pain, blood in stool, constipation, nausea and vomiting.  Genitourinary:  Negative for dysuria, frequency, hematuria and urgency  Musculoskeletal:  Negative for joint pain and  myalgias.  Neurological:  Negative for weakness.  Psychiatric/Behavioral:  Negative for depression. The patient does not have insomnia.    Current medications, allergy, social history, past surgical history, past medical history, family history were all reviewed.    Vitals:  BP (!) 140/62 Comment: manual recheck, MD notified  Pulse 92   Temp 97.7 F (36.5 C) (Oral)   Resp 20   Wt 176 lb 12.8 oz (80.2 kg)   SpO2 98%   BMI 31.33 kg/m    Physical Exam:  Physical Exam Exam conducted with a chaperone present.  Constitutional:      General: She is not in acute distress. Cardiovascular:     Rate and Rhythm: Normal rate and regular rhythm.  Pulmonary:     Effort: Pulmonary effort is normal.     Breath sounds: Normal breath sounds. No wheezing or rhonchi.  Abdominal:     Palpations: Abdomen is soft.     Tenderness: There is no abdominal tenderness. There is no right CVA tenderness or left CVA tenderness.     Hernia: No hernia is present.  Genitourinary:    General: Normal vulva.     Urethra: No urethral lesion.     Vagina: No lesions. No bleeding Musculoskeletal:     Cervical back: Neck supple.     Right lower leg: No edema.     Left lower leg: No edema.  Lymphadenopathy:     Upper Body:     Right upper body: No supraclavicular adenopathy.     Left upper body: No supraclavicular adenopathy.     Lower Body: No right inguinal adenopathy. No  left inguinal adenopathy.  Skin:    Findings: No rash.  Neurological:     Mental Status: She is oriented to person, place, and time.  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 Further evaluation with colposcopy and biopsies is warranted if the patient exhibits symptoms and/or examination findings concerning for additional disease.    I personally spent 25 minutes face-to-face and non-face-to-face in the care of this patient, which includes all pre, intra, and post visit time on the date of service.    [name], MD

## 2023-04-28 NOTE — Assessment & Plan Note (Addendum)
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 Further evaluation with colposcopy and biopsies is warranted if the patient exhibits symptoms and/or examination findings concerning for additional disease.

## 2023-06-15 DIAGNOSIS — L600 Ingrowing nail: Secondary | ICD-10-CM | POA: Diagnosis not present

## 2023-07-26 DIAGNOSIS — M9905 Segmental and somatic dysfunction of pelvic region: Secondary | ICD-10-CM | POA: Diagnosis not present

## 2023-07-26 DIAGNOSIS — M9901 Segmental and somatic dysfunction of cervical region: Secondary | ICD-10-CM | POA: Diagnosis not present

## 2023-07-26 DIAGNOSIS — M9903 Segmental and somatic dysfunction of lumbar region: Secondary | ICD-10-CM | POA: Diagnosis not present

## 2023-07-26 DIAGNOSIS — M9902 Segmental and somatic dysfunction of thoracic region: Secondary | ICD-10-CM | POA: Diagnosis not present

## 2023-08-09 DIAGNOSIS — M9905 Segmental and somatic dysfunction of pelvic region: Secondary | ICD-10-CM | POA: Diagnosis not present

## 2023-08-09 DIAGNOSIS — M9903 Segmental and somatic dysfunction of lumbar region: Secondary | ICD-10-CM | POA: Diagnosis not present

## 2023-08-09 DIAGNOSIS — M9902 Segmental and somatic dysfunction of thoracic region: Secondary | ICD-10-CM | POA: Diagnosis not present

## 2023-08-09 DIAGNOSIS — M9901 Segmental and somatic dysfunction of cervical region: Secondary | ICD-10-CM | POA: Diagnosis not present

## 2023-08-23 DIAGNOSIS — M9902 Segmental and somatic dysfunction of thoracic region: Secondary | ICD-10-CM | POA: Diagnosis not present

## 2023-08-23 DIAGNOSIS — M9901 Segmental and somatic dysfunction of cervical region: Secondary | ICD-10-CM | POA: Diagnosis not present

## 2023-08-23 DIAGNOSIS — M9903 Segmental and somatic dysfunction of lumbar region: Secondary | ICD-10-CM | POA: Diagnosis not present

## 2023-08-23 DIAGNOSIS — M9905 Segmental and somatic dysfunction of pelvic region: Secondary | ICD-10-CM | POA: Diagnosis not present

## 2023-09-07 DIAGNOSIS — M9905 Segmental and somatic dysfunction of pelvic region: Secondary | ICD-10-CM | POA: Diagnosis not present

## 2023-09-07 DIAGNOSIS — M9901 Segmental and somatic dysfunction of cervical region: Secondary | ICD-10-CM | POA: Diagnosis not present

## 2023-09-07 DIAGNOSIS — M9902 Segmental and somatic dysfunction of thoracic region: Secondary | ICD-10-CM | POA: Diagnosis not present

## 2023-09-07 DIAGNOSIS — M9903 Segmental and somatic dysfunction of lumbar region: Secondary | ICD-10-CM | POA: Diagnosis not present

## 2023-09-13 ENCOUNTER — Other Ambulatory Visit: Payer: PPO

## 2023-09-28 DIAGNOSIS — Z961 Presence of intraocular lens: Secondary | ICD-10-CM | POA: Diagnosis not present

## 2023-09-28 DIAGNOSIS — H43813 Vitreous degeneration, bilateral: Secondary | ICD-10-CM | POA: Diagnosis not present

## 2023-09-28 DIAGNOSIS — M9905 Segmental and somatic dysfunction of pelvic region: Secondary | ICD-10-CM | POA: Diagnosis not present

## 2023-09-28 DIAGNOSIS — M9903 Segmental and somatic dysfunction of lumbar region: Secondary | ICD-10-CM | POA: Diagnosis not present

## 2023-09-28 DIAGNOSIS — H35033 Hypertensive retinopathy, bilateral: Secondary | ICD-10-CM | POA: Diagnosis not present

## 2023-09-28 DIAGNOSIS — M9902 Segmental and somatic dysfunction of thoracic region: Secondary | ICD-10-CM | POA: Diagnosis not present

## 2023-09-28 DIAGNOSIS — H353133 Nonexudative age-related macular degeneration, bilateral, advanced atrophic without subfoveal involvement: Secondary | ICD-10-CM | POA: Diagnosis not present

## 2023-09-28 DIAGNOSIS — M9901 Segmental and somatic dysfunction of cervical region: Secondary | ICD-10-CM | POA: Diagnosis not present

## 2023-10-12 ENCOUNTER — Other Ambulatory Visit: Payer: Self-pay | Admitting: Family Medicine

## 2023-10-12 DIAGNOSIS — E2839 Other primary ovarian failure: Secondary | ICD-10-CM

## 2023-10-12 DIAGNOSIS — M9903 Segmental and somatic dysfunction of lumbar region: Secondary | ICD-10-CM | POA: Diagnosis not present

## 2023-10-12 DIAGNOSIS — M9905 Segmental and somatic dysfunction of pelvic region: Secondary | ICD-10-CM | POA: Diagnosis not present

## 2023-10-12 DIAGNOSIS — M9902 Segmental and somatic dysfunction of thoracic region: Secondary | ICD-10-CM | POA: Diagnosis not present

## 2023-10-12 DIAGNOSIS — M9901 Segmental and somatic dysfunction of cervical region: Secondary | ICD-10-CM | POA: Diagnosis not present

## 2023-10-20 ENCOUNTER — Encounter: Payer: Self-pay | Admitting: Obstetrics & Gynecology

## 2023-10-20 ENCOUNTER — Inpatient Hospital Stay: Attending: Obstetrics & Gynecology | Admitting: Obstetrics & Gynecology

## 2023-10-20 VITALS — BP 135/56 | HR 87 | Temp 98.2°F | Resp 17 | Ht 62.72 in | Wt 177.2 lb

## 2023-10-20 DIAGNOSIS — N904 Leukoplakia of vulva: Secondary | ICD-10-CM | POA: Diagnosis not present

## 2023-10-20 DIAGNOSIS — Z8544 Personal history of malignant neoplasm of other female genital organs: Secondary | ICD-10-CM | POA: Diagnosis not present

## 2023-10-20 DIAGNOSIS — Z86002 Personal history of in-situ neoplasm of other and unspecified genital organs: Secondary | ICD-10-CM | POA: Diagnosis not present

## 2023-10-20 DIAGNOSIS — Z9889 Other specified postprocedural states: Secondary | ICD-10-CM | POA: Diagnosis not present

## 2023-10-20 DIAGNOSIS — C519 Malignant neoplasm of vulva, unspecified: Secondary | ICD-10-CM

## 2023-10-20 NOTE — Progress Notes (Signed)
 Follow Up Note: Gyn-Onc  Andrea Gomez 83 y.o. female  CC: She presents for a follow-up visit   HPI: The oncology history was reviewed. 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   More recently, on 10/14/2022, she underwent carbon dioxide laser ablation to the vulva, perineal biopsy for VIN 3. Biopsy from the perineum returned with lichenoid inflammation, negative for dysplasia or malignancy.    Interval History: She denies any new lesions, itching, leg/groin pain, urinary symptoms, leg swelling, weight loss or cough.       Review of Systems  Review of Systems  Constitutional:  Negative for malaise/fatigue and weight loss.  Respiratory:  Negative for shortness of breath and wheezing.   Cardiovascular:  Negative for chest pain and leg swelling.  Gastrointestinal:  Negative for abdominal pain, blood in stool, constipation, nausea and vomiting.  Genitourinary:  Negative for dysuria, frequency, hematuria and urgency  Musculoskeletal:  Negative for joint pain and myalgias.  Neurological:  Negative for weakness.  Psychiatric/Behavioral:  Negative for depression. The patient does not have insomnia.    Current medications, allergy, social history, past surgical history, past medical history, family history were all reviewed.    Vitals:  BP (!) 135/56 (BP Location: Left Arm, Patient Position: Sitting) Comment: CMA notified  Pulse 87   Temp 98.2 F (36.8 C) (Oral)   Resp 17   Ht 5' 2.72" (1.593 m)   Wt 177 lb 3.2 oz (80.4 kg)   SpO2 95%   BMI 31.67 kg/m    Physical Exam:  Physical Exam Exam conducted with a chaperone present.  Constitutional:      General: She is not in acute distress. Cardiovascular:     Rate and Rhythm: Normal rate and regular rhythm.  Pulmonary:     Effort: Pulmonary effort is normal.     Breath  sounds: Normal breath sounds. No wheezing or rhonchi.  Abdominal:     Palpations: Abdomen is soft.     Tenderness: There is no abdominal tenderness. There is no right CVA tenderness or left CVA tenderness.     Hernia: No hernia is present.  Genitourinary:    General: Normal vulva.     Urethra: No urethral lesion.     Vagina: No lesions. No bleeding Musculoskeletal:     Cervical back: Neck supple.     Right lower leg: No edema.     Left lower leg: No edema.  Lymphadenopathy:     Upper Body:     Right upper body: No supraclavicular adenopathy.     Left upper body: No supraclavicular adenopathy.     Lower Body: No right inguinal adenopathy. No left inguinal adenopathy.  Skin:    Findings: No rash.  Neurological:     Mental Status: She is oriented to person, place, and time.  Physical Exam Genitourinary:      Comments: Multiple flat, hypopigmented scars     Assessment/Plan:  Vulva cancer Vulvar cancer (HCC) 83 y.o.  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 S/P laser ablation of recurrent VIN3 in 5/24.  Negative symptom review.  Normal exam.    > return in 6 months    I personally spent 25 minutes face-to-face and non-face-to-face in the care of this  patient, which includes all pre, intra, and post visit time on the date of service.    Abdul Hodgkin, MD

## 2023-10-20 NOTE — Patient Instructions (Signed)
 Return in 6 months

## 2023-10-20 NOTE — Assessment & Plan Note (Signed)
 83 y.o.  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 S/P laser ablation of recurrent VIN3 in 5/24.  Negative symptom review.  Normal exam.    > return in 6 months

## 2023-10-21 DIAGNOSIS — M9901 Segmental and somatic dysfunction of cervical region: Secondary | ICD-10-CM | POA: Diagnosis not present

## 2023-10-21 DIAGNOSIS — M9903 Segmental and somatic dysfunction of lumbar region: Secondary | ICD-10-CM | POA: Diagnosis not present

## 2023-10-21 DIAGNOSIS — M9902 Segmental and somatic dysfunction of thoracic region: Secondary | ICD-10-CM | POA: Diagnosis not present

## 2023-10-21 DIAGNOSIS — M9905 Segmental and somatic dysfunction of pelvic region: Secondary | ICD-10-CM | POA: Diagnosis not present

## 2023-11-11 DIAGNOSIS — M9901 Segmental and somatic dysfunction of cervical region: Secondary | ICD-10-CM | POA: Diagnosis not present

## 2023-11-11 DIAGNOSIS — M9905 Segmental and somatic dysfunction of pelvic region: Secondary | ICD-10-CM | POA: Diagnosis not present

## 2023-11-11 DIAGNOSIS — M9903 Segmental and somatic dysfunction of lumbar region: Secondary | ICD-10-CM | POA: Diagnosis not present

## 2023-11-11 DIAGNOSIS — M9902 Segmental and somatic dysfunction of thoracic region: Secondary | ICD-10-CM | POA: Diagnosis not present

## 2023-11-12 ENCOUNTER — Telehealth: Payer: Self-pay | Admitting: *Deleted

## 2023-11-12 DIAGNOSIS — E2839 Other primary ovarian failure: Secondary | ICD-10-CM | POA: Diagnosis not present

## 2023-11-12 DIAGNOSIS — M8588 Other specified disorders of bone density and structure, other site: Secondary | ICD-10-CM | POA: Diagnosis not present

## 2023-11-12 DIAGNOSIS — N958 Other specified menopausal and perimenopausal disorders: Secondary | ICD-10-CM | POA: Diagnosis not present

## 2023-11-12 NOTE — Telephone Encounter (Signed)
 Spoke with Andrea Gomez who called the office complaining of a possible cyst she feels on her perineum. Pt states it is hard and it's on the area  between her anus and vagina to the right side. Pt reports it's tender to the touch, she can't tell if it's red, states its not draining. Pt states it's approximately 1" long. Pt denies fever, chills, bleeding and no problems with urination.  Advised patient to try warm compresses and a sitz bath. Provider will be notified.

## 2023-11-12 NOTE — Telephone Encounter (Signed)
 Spoke with Ms. Tirone and relayed message from Vira Grieves, NP to apply warm compresses to the area several times a day and also use the sitz bath and call the office with any changes or increased symptoms. Pt was also scheduled for a follow up with Dr. Gaylin Ke on Wednesday, June 11 th at 2:45 pm. Pt agreed to date and time and thanked the office for calling back.

## 2023-11-23 DIAGNOSIS — M9905 Segmental and somatic dysfunction of pelvic region: Secondary | ICD-10-CM | POA: Diagnosis not present

## 2023-11-23 DIAGNOSIS — M9902 Segmental and somatic dysfunction of thoracic region: Secondary | ICD-10-CM | POA: Diagnosis not present

## 2023-11-23 DIAGNOSIS — M9903 Segmental and somatic dysfunction of lumbar region: Secondary | ICD-10-CM | POA: Diagnosis not present

## 2023-11-23 DIAGNOSIS — M9901 Segmental and somatic dysfunction of cervical region: Secondary | ICD-10-CM | POA: Diagnosis not present

## 2023-11-24 ENCOUNTER — Inpatient Hospital Stay: Attending: Obstetrics & Gynecology | Admitting: Obstetrics & Gynecology

## 2023-11-24 VITALS — BP 136/68 | HR 92 | Temp 97.5°F | Resp 16 | Ht 62.0 in | Wt 175.6 lb

## 2023-11-24 DIAGNOSIS — Z8544 Personal history of malignant neoplasm of other female genital organs: Secondary | ICD-10-CM | POA: Insufficient documentation

## 2023-11-24 DIAGNOSIS — N9089 Other specified noninflammatory disorders of vulva and perineum: Secondary | ICD-10-CM | POA: Insufficient documentation

## 2023-11-24 DIAGNOSIS — C519 Malignant neoplasm of vulva, unspecified: Secondary | ICD-10-CM

## 2023-11-24 NOTE — Patient Instructions (Signed)
Keep previously scheduled appointment.

## 2023-11-24 NOTE — Progress Notes (Signed)
 Follow Up Note: Gyn-Onc  Andrea Gomez 83 y.o. female  CC: Painful, vulva bump   HPI: Discussed the use of AI scribe software for clinical note transcription with the patient, who gave verbal consent to proceed.  History of Present Illness Andrea Gomez is an 83 year old female who presents with a suspected cyst in the perineal area.  Approximately two weeks after her last visit, she noticed a cyst in the perineal area. The cyst appeared suddenly, was initially larger, but has since decreased in size. No abnormal discharge or drainage has been observed from the cyst. No itching in the area of the cyst. She confirms she does not shave the area where the cyst is located.  She mentions a sensation of irritation on the opposite side of the perineal area but is unsure if it is significant. She has a history of using medication for yeast infections, which she has on hand, but she has not experienced any itching this year.    Review of Systems  Review of Systems  Constitutional:  Negative for malaise/fatigue and weight loss.  Respiratory:  Negative for shortness of breath and wheezing.   Cardiovascular:  Negative for chest pain and leg swelling.  Gastrointestinal:  Negative for abdominal pain, blood in stool, constipation, nausea and vomiting.  Genitourinary:  See above Musculoskeletal:  Negative for joint pain and myalgias.  Neurological:  Negative for weakness.  Psychiatric/Behavioral:  Negative for depression. The patient does not have insomnia.    Current medications, allergy, social history, past surgical history, past medical history, family history were all reviewed.    Vitals:  Blood pressure 136/68, pulse 92, temperature (!) 97.5 F (36.4 C), temperature source Oral, resp. rate 16, height 5' 2 (1.575 m), weight 175 lb 9.6 oz (79.7 kg), SpO2 97%.  Physical Exam:  Physical Exam Exam conducted with a chaperone present.  Constitutional:      General: She is not in acute  distress. Cardiovascular:     Rate and Rhythm: Normal rate and regular rhythm.  Pulmonary:     Effort: Pulmonary effort is normal.     Breath sounds: Normal breath sounds. No wheezing or rhonchi.  Abdominal:     Palpations: Abdomen is soft.     Tenderness: There is no abdominal tenderness. There is no right CVA tenderness or left CVA tenderness.     Hernia: No hernia is present.  Genitourinary:    General: Right-sided perineal nodule--deep, 3 mm, mildly tender    Urethra: No urethral lesion.     Vagina: No lesions. No bleeding Musculoskeletal:     Cervical back: Neck supple.     Right lower leg: No edema.     Left lower leg: No edema.  Lymphadenopathy:     Upper Body:     Right upper body: No supraclavicular adenopathy.     Left upper body: No supraclavicular adenopathy.     Lower Body: No right inguinal adenopathy. No left inguinal adenopathy.  Skin:    Findings: No rash.  Neurological:     Mental Status: She is oriented to person, place, and time.   Assessment/Plan:  Assessment & Plan Vulvar mass  1. Vulvar mass Small, subcutaneous nodule. Mildly tender; decreasing in size.  Sxs improving.  DDx includes epidermoid cyst, sebaceous cyst, folliculitis     - Continue to monitor for changes in size, drainage, pain - Return w/signs of infection - Warm soaks prn - Keep previously scheduled appointment  I personally spent 25  minutes face-to-face and non-face-to-face in the care of this patient, which includes all pre, intra, and post visit time on the date of service.      Abdul Hodgkin, MD

## 2023-11-25 ENCOUNTER — Encounter: Payer: Self-pay | Admitting: Obstetrics & Gynecology

## 2023-11-25 NOTE — Assessment & Plan Note (Signed)
  1. Vulvar mass Small, subcutaneous nodule. Mildly tender; decreasing in size.  Sxs improving.  DDx includes epidermoid cyst, sebaceous cyst, folliculitis

## 2023-12-07 DIAGNOSIS — M9901 Segmental and somatic dysfunction of cervical region: Secondary | ICD-10-CM | POA: Diagnosis not present

## 2023-12-07 DIAGNOSIS — M9905 Segmental and somatic dysfunction of pelvic region: Secondary | ICD-10-CM | POA: Diagnosis not present

## 2023-12-07 DIAGNOSIS — M9902 Segmental and somatic dysfunction of thoracic region: Secondary | ICD-10-CM | POA: Diagnosis not present

## 2023-12-07 DIAGNOSIS — M9903 Segmental and somatic dysfunction of lumbar region: Secondary | ICD-10-CM | POA: Diagnosis not present

## 2023-12-20 DIAGNOSIS — M9903 Segmental and somatic dysfunction of lumbar region: Secondary | ICD-10-CM | POA: Diagnosis not present

## 2023-12-20 DIAGNOSIS — M9901 Segmental and somatic dysfunction of cervical region: Secondary | ICD-10-CM | POA: Diagnosis not present

## 2023-12-20 DIAGNOSIS — M9902 Segmental and somatic dysfunction of thoracic region: Secondary | ICD-10-CM | POA: Diagnosis not present

## 2023-12-20 DIAGNOSIS — M9905 Segmental and somatic dysfunction of pelvic region: Secondary | ICD-10-CM | POA: Diagnosis not present

## 2023-12-27 DIAGNOSIS — I10 Essential (primary) hypertension: Secondary | ICD-10-CM | POA: Diagnosis not present

## 2024-01-04 DIAGNOSIS — M9905 Segmental and somatic dysfunction of pelvic region: Secondary | ICD-10-CM | POA: Diagnosis not present

## 2024-01-04 DIAGNOSIS — M9901 Segmental and somatic dysfunction of cervical region: Secondary | ICD-10-CM | POA: Diagnosis not present

## 2024-01-04 DIAGNOSIS — M9902 Segmental and somatic dysfunction of thoracic region: Secondary | ICD-10-CM | POA: Diagnosis not present

## 2024-01-04 DIAGNOSIS — M9903 Segmental and somatic dysfunction of lumbar region: Secondary | ICD-10-CM | POA: Diagnosis not present

## 2024-01-13 DIAGNOSIS — I10 Essential (primary) hypertension: Secondary | ICD-10-CM | POA: Diagnosis not present

## 2024-01-13 DIAGNOSIS — E7800 Pure hypercholesterolemia, unspecified: Secondary | ICD-10-CM | POA: Diagnosis not present

## 2024-01-18 DIAGNOSIS — M9902 Segmental and somatic dysfunction of thoracic region: Secondary | ICD-10-CM | POA: Diagnosis not present

## 2024-01-18 DIAGNOSIS — M9903 Segmental and somatic dysfunction of lumbar region: Secondary | ICD-10-CM | POA: Diagnosis not present

## 2024-01-18 DIAGNOSIS — M9905 Segmental and somatic dysfunction of pelvic region: Secondary | ICD-10-CM | POA: Diagnosis not present

## 2024-01-18 DIAGNOSIS — M9901 Segmental and somatic dysfunction of cervical region: Secondary | ICD-10-CM | POA: Diagnosis not present

## 2024-01-25 DIAGNOSIS — I10 Essential (primary) hypertension: Secondary | ICD-10-CM | POA: Diagnosis not present

## 2024-02-01 DIAGNOSIS — M9905 Segmental and somatic dysfunction of pelvic region: Secondary | ICD-10-CM | POA: Diagnosis not present

## 2024-02-01 DIAGNOSIS — M9903 Segmental and somatic dysfunction of lumbar region: Secondary | ICD-10-CM | POA: Diagnosis not present

## 2024-02-01 DIAGNOSIS — M9902 Segmental and somatic dysfunction of thoracic region: Secondary | ICD-10-CM | POA: Diagnosis not present

## 2024-02-01 DIAGNOSIS — M9901 Segmental and somatic dysfunction of cervical region: Secondary | ICD-10-CM | POA: Diagnosis not present

## 2024-02-08 DIAGNOSIS — M9905 Segmental and somatic dysfunction of pelvic region: Secondary | ICD-10-CM | POA: Diagnosis not present

## 2024-02-08 DIAGNOSIS — Z79899 Other long term (current) drug therapy: Secondary | ICD-10-CM | POA: Diagnosis not present

## 2024-02-08 DIAGNOSIS — E559 Vitamin D deficiency, unspecified: Secondary | ICD-10-CM | POA: Diagnosis not present

## 2024-02-08 DIAGNOSIS — M9903 Segmental and somatic dysfunction of lumbar region: Secondary | ICD-10-CM | POA: Diagnosis not present

## 2024-02-08 DIAGNOSIS — M9901 Segmental and somatic dysfunction of cervical region: Secondary | ICD-10-CM | POA: Diagnosis not present

## 2024-02-08 DIAGNOSIS — E7800 Pure hypercholesterolemia, unspecified: Secondary | ICD-10-CM | POA: Diagnosis not present

## 2024-02-08 DIAGNOSIS — M9902 Segmental and somatic dysfunction of thoracic region: Secondary | ICD-10-CM | POA: Diagnosis not present

## 2024-02-10 DIAGNOSIS — Z1331 Encounter for screening for depression: Secondary | ICD-10-CM | POA: Diagnosis not present

## 2024-02-10 DIAGNOSIS — I10 Essential (primary) hypertension: Secondary | ICD-10-CM | POA: Diagnosis not present

## 2024-02-10 DIAGNOSIS — N1831 Chronic kidney disease, stage 3a: Secondary | ICD-10-CM | POA: Diagnosis not present

## 2024-02-10 DIAGNOSIS — E7800 Pure hypercholesterolemia, unspecified: Secondary | ICD-10-CM | POA: Diagnosis not present

## 2024-02-10 DIAGNOSIS — F419 Anxiety disorder, unspecified: Secondary | ICD-10-CM | POA: Diagnosis not present

## 2024-02-10 DIAGNOSIS — M8589 Other specified disorders of bone density and structure, multiple sites: Secondary | ICD-10-CM | POA: Diagnosis not present

## 2024-02-10 DIAGNOSIS — Z0000 Encounter for general adult medical examination without abnormal findings: Secondary | ICD-10-CM | POA: Diagnosis not present

## 2024-02-10 DIAGNOSIS — K219 Gastro-esophageal reflux disease without esophagitis: Secondary | ICD-10-CM | POA: Diagnosis not present

## 2024-02-13 DIAGNOSIS — E7800 Pure hypercholesterolemia, unspecified: Secondary | ICD-10-CM | POA: Diagnosis not present

## 2024-02-13 DIAGNOSIS — I10 Essential (primary) hypertension: Secondary | ICD-10-CM | POA: Diagnosis not present

## 2024-02-29 DIAGNOSIS — M9905 Segmental and somatic dysfunction of pelvic region: Secondary | ICD-10-CM | POA: Diagnosis not present

## 2024-02-29 DIAGNOSIS — M9903 Segmental and somatic dysfunction of lumbar region: Secondary | ICD-10-CM | POA: Diagnosis not present

## 2024-02-29 DIAGNOSIS — M9901 Segmental and somatic dysfunction of cervical region: Secondary | ICD-10-CM | POA: Diagnosis not present

## 2024-02-29 DIAGNOSIS — M9902 Segmental and somatic dysfunction of thoracic region: Secondary | ICD-10-CM | POA: Diagnosis not present

## 2024-03-14 DIAGNOSIS — M9905 Segmental and somatic dysfunction of pelvic region: Secondary | ICD-10-CM | POA: Diagnosis not present

## 2024-03-14 DIAGNOSIS — M9902 Segmental and somatic dysfunction of thoracic region: Secondary | ICD-10-CM | POA: Diagnosis not present

## 2024-03-14 DIAGNOSIS — M9901 Segmental and somatic dysfunction of cervical region: Secondary | ICD-10-CM | POA: Diagnosis not present

## 2024-03-14 DIAGNOSIS — M9903 Segmental and somatic dysfunction of lumbar region: Secondary | ICD-10-CM | POA: Diagnosis not present

## 2024-03-14 DIAGNOSIS — I10 Essential (primary) hypertension: Secondary | ICD-10-CM | POA: Diagnosis not present

## 2024-03-14 DIAGNOSIS — E7800 Pure hypercholesterolemia, unspecified: Secondary | ICD-10-CM | POA: Diagnosis not present

## 2024-03-25 DIAGNOSIS — I10 Essential (primary) hypertension: Secondary | ICD-10-CM | POA: Diagnosis not present

## 2024-03-28 DIAGNOSIS — M9901 Segmental and somatic dysfunction of cervical region: Secondary | ICD-10-CM | POA: Diagnosis not present

## 2024-03-28 DIAGNOSIS — M9903 Segmental and somatic dysfunction of lumbar region: Secondary | ICD-10-CM | POA: Diagnosis not present

## 2024-03-28 DIAGNOSIS — M9902 Segmental and somatic dysfunction of thoracic region: Secondary | ICD-10-CM | POA: Diagnosis not present

## 2024-03-28 DIAGNOSIS — M9905 Segmental and somatic dysfunction of pelvic region: Secondary | ICD-10-CM | POA: Diagnosis not present

## 2024-04-14 DIAGNOSIS — E7800 Pure hypercholesterolemia, unspecified: Secondary | ICD-10-CM | POA: Diagnosis not present

## 2024-04-14 DIAGNOSIS — I10 Essential (primary) hypertension: Secondary | ICD-10-CM | POA: Diagnosis not present

## 2024-04-24 ENCOUNTER — Encounter: Payer: Self-pay | Admitting: Obstetrics & Gynecology

## 2024-04-24 DIAGNOSIS — Z961 Presence of intraocular lens: Secondary | ICD-10-CM | POA: Diagnosis not present

## 2024-04-24 DIAGNOSIS — H43813 Vitreous degeneration, bilateral: Secondary | ICD-10-CM | POA: Diagnosis not present

## 2024-04-24 DIAGNOSIS — H353133 Nonexudative age-related macular degeneration, bilateral, advanced atrophic without subfoveal involvement: Secondary | ICD-10-CM | POA: Diagnosis not present

## 2024-04-24 DIAGNOSIS — I10 Essential (primary) hypertension: Secondary | ICD-10-CM | POA: Diagnosis not present

## 2024-04-24 DIAGNOSIS — H35033 Hypertensive retinopathy, bilateral: Secondary | ICD-10-CM | POA: Diagnosis not present

## 2024-04-26 ENCOUNTER — Inpatient Hospital Stay: Attending: Obstetrics & Gynecology | Admitting: Obstetrics & Gynecology

## 2024-04-26 VITALS — BP 142/66 | HR 84 | Temp 98.3°F | Resp 20 | Wt 177.4 lb

## 2024-04-26 DIAGNOSIS — Z86002 Personal history of in-situ neoplasm of other and unspecified genital organs: Secondary | ICD-10-CM | POA: Diagnosis not present

## 2024-04-26 DIAGNOSIS — Z9889 Other specified postprocedural states: Secondary | ICD-10-CM | POA: Diagnosis not present

## 2024-04-26 DIAGNOSIS — C519 Malignant neoplasm of vulva, unspecified: Secondary | ICD-10-CM

## 2024-04-26 NOTE — Patient Instructions (Signed)
  VISIT SUMMARY: Today, you were seen for mild irritation at the site of your previous surgery. You mentioned that the irritation is not significant and there are no new symptoms such as itching, new bumps, or pain. Your blood pressure was a bit high, which you believe is due to being nervous about the visit. You also mentioned having a busy week with multiple medical appointments.  YOUR PLAN: -MALIGNANT NEOPLASM OF VULVA, POST-SURGICAL SURVEILLANCE: This means that you are being monitored after surgery for vulvar cancer. There are no new symptoms, and the mild irritation at the surgical site is not concerning. You should continue with your routine post-surgical check-ups.  INSTRUCTIONS: Please continue with your routine post-surgical surveillance and follow up in six months.                      Contains text generated by [employer].                                 Contains text generated by [employer].

## 2024-04-26 NOTE — Assessment & Plan Note (Signed)
 83 y.o.  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 S/P laser ablation of recurrent VIN3 in 5/24.  Negative symptom review.  Normal exam.    > return in 6 months

## 2024-04-26 NOTE — Progress Notes (Unsigned)
 Follow Up Note: Gyn-Onc  Andrea Gomez 83 y.o. female  CC: She presents for a follow-up visit   HPI: The oncology history was reviewed. 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   More recently, on 10/14/2022, she underwent carbon dioxide laser ablation to the vulva, perineal biopsy for VIN 3. Biopsy from the perineum returned with lichenoid inflammation, negative for dysplasia or malignancy.    Interval History: She experiences mild irritation at the site of a previous surgery. She denies any new lesions, itching, leg/groin pain, urinary symptoms, leg swelling, weight loss or cough.  Discussed the use of AI scribe software for clinical note transcription with the patient, who gave verbal consent to proceed.     Review of Systems  Review of Systems  Constitutional:  Negative for malaise/fatigue and weight loss.  Respiratory:  Negative for shortness of breath and wheezing.   Cardiovascular:  Negative for chest pain and leg swelling.  Gastrointestinal:  Negative for abdominal pain, blood in stool, constipation, nausea and vomiting.  Genitourinary:  See above Musculoskeletal:  Negative for joint pain and myalgias.  Neurological:  Negative for weakness.  Psychiatric/Behavioral:  Negative for depression. The patient does not have insomnia.    Current medications, allergy, social history, past surgical history, past medical history, family history were all reviewed.    Vitals:  BP (!) 142/66 Comment: manual recheck, no S&S will monitor and f/u with PCP  Pulse 84   Temp 98.3 F (36.8 C) (Oral)   Resp 20   Wt 177 lb 6.4 oz (80.5 kg)   SpO2 98%   BMI 32.45 kg/m     Physical Exam Exam conducted with a chaperone present.  Constitutional:      General: She is not in acute distress. Cardiovascular:     Rate and Rhythm:  Normal rate and regular rhythm.  Pulmonary:     Effort: Pulmonary effort is normal.     Breath sounds: Normal breath sounds. No wheezing or rhonchi.  Abdominal:     Palpations: Abdomen is soft.     Tenderness: There is no abdominal tenderness. There is no right CVA tenderness or left CVA tenderness.     Hernia: No hernia is present.  Genitourinary:    General: Scarring at posterior fourchette.    Urethra: No urethral lesion.     Vagina: No lesions. No bleeding Musculoskeletal:     Cervical back: Neck supple.     Right lower leg: No edema.     Left lower leg: No edema.  Lymphadenopathy:     Upper Body:     Right upper body: No supraclavicular adenopathy.     Left upper body: No supraclavicular adenopathy.     Lower Body: No right inguinal adenopathy. No left inguinal adenopathy.  Skin:    Findings: No rash.  Neurological:     Mental Status: She is oriented to person, place, and time.  Physical Exam Genitourinary:     Comments: Multiple flat, hypopigmented scars     Assessment/Plan:  Vulva cancer Vulvar cancer (HCC) 83 y.o.  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 S/P laser ablation of recurrent VIN3 in 5/24.  Negative symptom review.  Normal exam.     > return in 6  months     I personally spent 25 minutes face-to-face and non-face-to-face in the care of this patient, which includes all pre, intra, and post visit time on the date of service.    Olam Mill, MD

## 2024-04-28 ENCOUNTER — Encounter: Payer: Self-pay | Admitting: Obstetrics & Gynecology
